# Patient Record
Sex: Male | Born: 1965 | Race: White | Hispanic: No | Marital: Married | Smoking: Former smoker
Health system: Southern US, Community
[De-identification: ages and names within clinical notes are randomized; demographics above are authoritative.]

## PROBLEM LIST (undated history)

## (undated) HISTORY — PX: BACK SURGERY: SHX140

## (undated) HISTORY — PX: OTHER SURGICAL HISTORY: SHX169

## (undated) HISTORY — PX: SHOULDER SURGERY: SHX246

## (undated) HISTORY — PX: WISDOM TOOTH EXTRACTION: SHX21

## (undated) HISTORY — PX: TONSILLECTOMY: SUR1361

## (undated) HISTORY — PX: FOOT SURGERY: SHX648

---

## 2004-02-17 ENCOUNTER — Encounter: Admission: RE | Admit: 2004-02-17 | Discharge: 2004-02-17 | Payer: Self-pay | Admitting: Sports Medicine

## 2004-03-02 ENCOUNTER — Encounter: Admission: RE | Admit: 2004-03-02 | Discharge: 2004-03-02 | Payer: Self-pay | Admitting: Sports Medicine

## 2004-05-06 ENCOUNTER — Encounter: Admission: RE | Admit: 2004-05-06 | Discharge: 2004-05-06 | Payer: Self-pay | Admitting: Sports Medicine

## 2006-03-08 ENCOUNTER — Encounter: Admission: RE | Admit: 2006-03-08 | Discharge: 2006-03-08 | Payer: Self-pay | Admitting: Sports Medicine

## 2006-06-01 ENCOUNTER — Encounter: Admission: RE | Admit: 2006-06-01 | Discharge: 2006-06-01 | Payer: Self-pay | Admitting: Sports Medicine

## 2007-06-07 ENCOUNTER — Encounter: Admission: RE | Admit: 2007-06-07 | Discharge: 2007-06-07 | Payer: Self-pay | Admitting: Sports Medicine

## 2007-10-23 ENCOUNTER — Inpatient Hospital Stay (HOSPITAL_COMMUNITY): Admission: RE | Admit: 2007-10-23 | Discharge: 2007-10-26 | Payer: Self-pay | Admitting: Neurological Surgery

## 2007-10-23 ENCOUNTER — Ambulatory Visit: Payer: Self-pay | Admitting: *Deleted

## 2008-03-02 ENCOUNTER — Emergency Department (HOSPITAL_COMMUNITY): Admission: EM | Admit: 2008-03-02 | Discharge: 2008-03-02 | Payer: Self-pay | Admitting: Emergency Medicine

## 2010-04-25 ENCOUNTER — Encounter: Payer: Self-pay | Admitting: Sports Medicine

## 2010-08-18 NOTE — Discharge Summary (Signed)
NAMEKAMSIYOCHUKWU, SPICKLER                ACCOUNT NO.:  [id number]   MEDICAL RECORD NO.:  [id number]          PATIENT TYPE:  INP   LOCATION:  3039                         FACILITY:  [facility]   PHYSICIAN:  Stefani Dama, M.D.  DATE OF BIRTH:  1965/12/23   DATE OF ADMISSION:  10/23/2007  DATE OF DISCHARGE:  10/26/2007                               DISCHARGE SUMMARY   ADMITTING DIAGNOSES:  1. Spondylolisthesis, L4-L5.  2. Spondylosis, L4-L5, L5-S1.  3. Tobacco abuse.   DISCHARGE DIAGNOSES:  1. Spondylolisthesis, L4-L5.  2. Spondylosis, L4-L5, L5-S1.  3. Tobacco abuse.   BRIEF HISTORY:  The patient is a 45 year old male with severe history of  low back pain progressing over time, unrelieved with conservative  measures,  elects to proceed with surgical intervention.   HOSPITAL COURSE:  Mr. Basey underwent anterior lumbar interbody fusion  at L4-L5 and L5-S1 under general anesthesia on October 23, 2007, he  tolerated the procedure well.  He was stabilized in the recovery room  placed in the step-down unit, PCA morphine pump, and Ancef x3 doses  prophylaxis against infection.  Started with physical therapy and  occupational therapy, progressed with normal postop course.  Vital signs  remained stable throughout his hospitalization.  He was ready for  transfer to 3000 on first day postoperatively.  Continue with PT and OT  until he was ambulating safely.  His Foley catheter was discontinued.  PCA was discontinued.  He was using p.o. pain meds and muscle relaxers.  He was comfortable at the time of discharge.  He was ready for discharge  home on October 26, 2007, eating well and voiding well.  He had positive  bowel movement prior to his discharge.   DISCHARGE CONDITION:  Stable and improved.   DISCHARGE INSTRUCTIONS:  Followup with Dr. Danielle Dess or Orlin Hilding, his PA  in the office in 2 weeks.  Call [phone] for an appointment.  Given  prescription for Percocet 1 p.o. q.4-6 h. p.r.n. pain and Valium  5 mg 1  p.o. q.6-  8 h. p.r.n. muscle spasms.  Back precautions, Aspen QuickDraw brace  while ambulating.  He may shower.  Slowly increase activities.  No  lifting for 8 weeks.  No driving for 2 weeks and contact our office  prior to followup if there are any questions or concerns.      Aura Fey Bobbe Medico.      Stefani Dama, M.D.  Electronically Signed    SCI/MEDQ  D:  10/26/2007  T:  10/27/2007  Job:  [id number]

## 2010-08-18 NOTE — Op Note (Signed)
Micheal Cooper, Micheal Cooper                ACCOUNT NO.:  [id number]   MEDICAL RECORD NO.:  [id number]          PATIENT TYPE:  INP   LOCATION:  3301                         FACILITY:  [facility]   PHYSICIAN:  Stefani Dama, M.D.  DATE OF BIRTH:  1965/09/09   DATE OF PROCEDURE:  10/23/2007  DATE OF DISCHARGE:                               OPERATIVE REPORT   PREOPERATIVE DIAGNOSES:  1. Spondylosis and spondylolisthesis L4-L5 with lumbar radiculopathy.  2. Back pain.   POSTOPERATIVE DIAGNOSES:  1. Spondylosis and spondylolisthesis L4-L5 with lumbar radiculopathy.  2. Back pain.   PROCEDURES:  1. Anterior decompression at L4-5 and L5-S1 with decompression of L4,      L5, and S1 nerve roots bilaterally.  2. Arthrodesis with autograft and allograft and PEEK spacer with plate      fixation L4-5 and L5-S1.   SURGEON:  Stefani Dama, MD   FIRST ASSISTANT:  1. Hilda Lias, MD  2. Hardin Negus, PA  3. Approach is by P. Liliane Bade, MD   INDICATIONS:  Micheal Cooper is a 45 year old individual who has had  significant back pain, bilateral lower extremity pain.  He has evidence  of severe spondylosis with spondylolisthesis at L4-5 and spondylosis at  L5-S1.  He has been advised regarding surgical decompression and  arthrodesis.   PROCEDURE:  The patient was brought to the operating room, supine on the  stretcher.  After smooth induction of general endotracheal anesthesia  and prepping of the abdomen with alcohol and DuraPrep, Dr. Madilyn Fireman  performed the approach to the retroperitoneal space exposing L4 and L5,  which were localized positively on a radiograph.  Then with retractors  in place, I opened the anterior longitudinal ligament at L4-5 with great  difficulty as there was significant ossification in the anterior  longitudinal ligament.  The ossifications were taken down, and the  ligament was removed and the disk space was entered and significant  quantity of severely degenerated and  desiccated material was encountered  until the posterior aspect was identified.  At the posterior aspect,  complete diskectomy was completed out to the lateral recesses.  Once  this area was decompressed well, hemostasis in the epidural space was  obtained.  Care was taken to decompress the central portion of the  canal; however, this was done via subligamentous approach.  Endplates  were then smoothed and then a spacer measuring 12 mm in height, 30 x 33  mm in dimension, and 6 degrees of lordosis was placed into the  interspace.  Attention was then turned to L5-S1, where a similar  procedure was carried out.  Here, there was also substantial amount of  osteophytosis that had formed ventrally and the disk space itself was  even more desiccated.  As the posterior longitudinal ligament was  reached, the area was decompressed in a subligamentous fashion,  decompressing into the foramen at both L5 nerve roots and centrally  decompressed the S1 nerve roots.  The procedure was carried out with the  help of Dr. Jeral Fruit in placing the grafts and the decompression was  helped by  Hardin Negus, who helped to retract the retroperitoneal  contents to maintain exposure.  Ultimately, the spacers that were placed  included a 12 mm tall, 10-degree lordosis, 30 x 33-mm spacer at L5-S1.  Medium-sized nails were used to secure the anterior plate fixation at L4-  5 and L5-S1.  Once this was accomplished, hemostasis was checked in the  retroperitoneal space.  Final localizing radiographs were obtained.  Remainder of allograft was placed into the lateral gutters.  It should  be noted that the allograft was mixed with a bone marrow aspirate  obtained from the L4 vertebra using a Jamshidi needle and mixing this  material with Vitoss bone sponge and infused.  The wound was then closed  with #1 Vicryl in the anterior rectus sheath, 2-0 Vicryl  in the  subcutaneous tissues, 3-0 Vicryl subcuticularly with 4-0 Vicryl   subcuticularly, and Dermabond on the skin.  The patient tolerated  procedure well, was returned to recovery room in stable condition.   BLOOD LOSS ESTIMATED:  400 mL.      Stefani Dama, M.D.  Electronically Signed     HJE/MEDQ  D:  10/23/2007  T:  10/24/2007  Job:  [id number]

## 2010-08-18 NOTE — Op Note (Signed)
NAMECARI, VANDEBERG NO.:  [id number]   MEDICAL RECORD NO.:  [id number]          PATIENT TYPE:  INP   LOCATION:  3301                         FACILITY:  [facility]   PHYSICIAN:  Balinda Quails, M.D.    DATE OF BIRTH:  1966-02-11   DATE OF PROCEDURE:  10/23/2007  DATE OF DISCHARGE:                               OPERATIVE REPORT   SURGEON:  Balinda Quails, MD   CO-SURGEON:  Stefani Dama, MD   ANESTHETIC:  General endotracheal.   ANESTHESIOLOGIST:  Bedelia Person, MD   PREOPERATIVE DIAGNOSIS:  L4-5 and L5-S1 degenerative disk disease.   POSTOPERATIVE DIAGNOSIS:  L4-5 and L5-S1 degenerative disk disease.   PROCEDURE:  L4-5 and L5-S1 anterior lumbar interbody fusion (ALIF).   CLINICAL NOTE:  Dorance Spink is a 45 year old male with history of  chronic back pain and degenerative disease of L4-5 and L5-S1.  Scheduled  at this time to undergo two-level ALIF at L4-5 and L5-S1.  The patient  was seen preoperatively and details of the operative procedure were  reviewed.  Potential complications were reviewed including but not  limited to bleeding, transfusion, infection, DVT, pulmonary embolus,  ureter injury, sexual dysfunction or other complications.  The patient  consented for surgery.   OPERATIVE PROCEDURE:  The patient brought to the operating room in  stable condition.  Placed under general endotracheal anesthesia.  In the  supine position, the abdomen prepped and draped in sterile fashion.  Foley catheter, arterial line and Swan-Ganz catheter in place.  Pulse  oximetry on the left foot.   A longitudinal skin incision made from xiphoid to umbilicus along the  left paramedian line.  Subcutaneous tissue divided with electrocautery.  The left anterior rectus sheath incised longitudinally.  The left rectus  muscle mobilized medially.  The left retroperitoneal space entered  inferiorly and the peritoneum pushed off the posterior rectus sheath  which was incised  longitudinally.  The peritoneal contents rotated  anteriorly.  The psoas muscle and genitofemoral nerve identified and the  nerve preserved on the muscle.  The left common iliac and external iliac  artery were identified.  The ureter and nerve fibers were pushed with  the abdominal contents medially and lymphatics pushed laterally.  The L5-  S1 disk was palpated.  This was cleared with blunt dissection and the  middle sacral vessels were ligated with bipolar cautery and divided.  The L5-S1 disk was fully exposed from left-to-right with blunt  dissection.   Attention then placed on the L4-5 disk.  This could be palpated  laterally with vessels.  The lymphatics pushed laterally.  Two  iliolumbar veins were ligated with 2-0 silk and divided.  This allowed  full mobilization of the iliac vein.  This was mobilized medially.  The  L4 segmental vessels were ligated and divided.  This allowed full  mobilization of the left iliac vessels to the right.  These were  pushed  to the right to allow full exposure of the L4-5 disk.  There was marked  degeneration of the disk noted.  The Thompson-Brau retractor was  then  assembled and the reverse lip blades were placed on the lateral margins  of the bodies of L4-L5 bilaterally.  This allowed full exposure  of the disk.  Dr. Danielle Dess then completed the L4-5 ALIF.  This was  followed by L5-S1 ALIF with reposition of the retractors.   There were no apparent complications.  Dr. Danielle Dess completed closure  dictated under separate heading.      Balinda Quails, M.D.  Electronically Signed     PGH/MEDQ  D:  10/24/2007  T:  10/24/2007  Job:  [id number]

## 2010-12-31 LAB — CBC
HCT: 42.4
Hemoglobin: 15
MCHC: 35.5
MCV: 92.2
Platelets: 178
RBC: 4.6
RDW: 12.7
WBC: 4.6

## 2011-01-01 LAB — ABO/RH: ABO/RH(D): A POS

## 2011-01-01 LAB — TYPE AND SCREEN
ABO/RH(D): A POS
Antibody Screen: NEGATIVE

## 2011-10-22 ENCOUNTER — Emergency Department (HOSPITAL_COMMUNITY)
Admission: EM | Admit: 2011-10-22 | Discharge: 2011-10-22 | Disposition: A | Discharge status: Home / Self Care | Payer: BC Managed Care – PPO | Attending: Emergency Medicine | Admitting: Emergency Medicine

## 2011-10-22 ENCOUNTER — Encounter (HOSPITAL_COMMUNITY): Payer: Self-pay | Admitting: *Deleted

## 2011-10-22 DIAGNOSIS — M7989 Other specified soft tissue disorders: Secondary | ICD-10-CM | POA: Insufficient documentation

## 2011-10-22 DIAGNOSIS — Z7982 Long term (current) use of aspirin: Secondary | ICD-10-CM | POA: Insufficient documentation

## 2011-10-22 MED ORDER — OXYCODONE-ACETAMINOPHEN 5-325 MG PO TABS: 1.0000 | ORAL_TABLET | Freq: Four times a day (QID) | ORAL | Status: AC | PRN

## 2011-10-22 MED ORDER — ENOXAPARIN SODIUM 150 MG/ML ~~LOC~~ SOLN
1.5000 mg/kg | Freq: Once | SUBCUTANEOUS | Status: AC
  Administered 2011-10-22: 145 mg via SUBCUTANEOUS
  Filled 2011-10-22: qty 1.6

## 2011-10-22 NOTE — ED Notes (Signed)
Pt c/o pain and swelling to right lower extremity. States pain started last night. Pt states he frequently goes on long road trips. Pt sent from urgent care to r/o dvt. pts right calf noted to be right and swollen.

## 2011-10-22 NOTE — ED Provider Notes (Signed)
History     CSN: [id number]  Arrival date & time 10/22/11  Micheal Cooper   First MD Initiated Contact with Patient 10/22/11 2038      Chief Complaint  Patient presents with  . Leg Pain    (Consider location/radiation/quality/duration/timing/severity/associated sxs/prior treatment) HPI Comments: Patient with no significant past medical history presents emergency department from urgent care for right lower extremity leg swelling. Onset of symptoms began acutely last evening, have been gradually worsening and are described as calf pain with ambulation, swelling and warmth of calf. Patient denies fevers, night sweats, chills, cough, hemoptysis, SOB, DOE, CP, or palpitations.   Patient is a 46 y.o. male presenting with leg pain. The history is provided by the patient.  Leg Pain     History reviewed. No pertinent past medical history.  Past Surgical History  Procedure Date  . Back surgery   . Tonsillectomy   . Foot surgery   . Wisdom tooth extraction     History reviewed. No pertinent family history.  History  Substance Use Topics  . Smoking status: Not on file  . Smokeless tobacco: Not on file  . Alcohol Use: Yes      Review of Systems  All other systems reviewed and are negative.    Allergies  Review of patient's allergies indicates no known allergies.  Home Medications   Current Outpatient Rx  Name Route Sig Dispense Refill  . ASPIRIN 325 MG PO TABS Oral Take 325 mg by mouth daily.      BP 125/85  Pulse 61  Temp 97.7 F (36.5 C) (Oral)  Resp 18  Wt 210 lb (95.255 kg)  SpO2 97%  Physical Exam  Nursing note and vitals reviewed. Constitutional: He is oriented to person, place, and time. He appears well-developed and well-nourished. No distress.  HENT:  Head: Normocephalic and atraumatic.  Eyes: Conjunctivae and EOM are normal.  Neck: Normal range of motion.  Cardiovascular:       RRR, intact distal pulses  Pulmonary/Chest: Effort normal.       LCAB, no  respiratory distress, pulse ox 100% on RA  Musculoskeletal: Normal range of motion.       Calf pain with ambulation and left foot flexion, calf ttp inflamed and taught. Mild warmth, no erythema  Neurological: He is alert and oriented to person, place, and time.  Skin: Skin is warm and dry. No rash noted. He is not diaphoretic.  Psychiatric: He has a normal mood and affect. His behavior is normal.    ED Course  Procedures (including critical care time)  Labs Reviewed - No data to display No results found.   No diagnosis found.    MDM  ? DVT.   Pt treated w Lovenox in ER with Vascular US pending tmw AM to confirm dx. Strict return precautions discussed including development of fever, SOB, CP, or tachycardia. NO current concern for PE. Pt verbalizes understanding and necessity for follow up & appears reliable. Spoke with wife & answered questions, both agreeable with plan.        Jaci Carrel, [place] 10/22/11 2127

## 2011-10-23 ENCOUNTER — Ambulatory Visit (HOSPITAL_COMMUNITY)
Admission: RE | Admit: 2011-10-23 | Discharge: 2011-10-23 | Payer: BC Managed Care – PPO | Source: Ambulatory Visit | Attending: Emergency Medicine | Admitting: Emergency Medicine

## 2011-10-23 DIAGNOSIS — M7989 Other specified soft tissue disorders: Secondary | ICD-10-CM

## 2011-10-23 NOTE — ED Provider Notes (Signed)
Medical screening examination/treatment/procedure(s) were performed by non-physician practitioner and as supervising physician I was immediately available for consultation/collaboration.   Dshawn Mcnay Y. Donnita Farina, MD 10/23/11 1859 

## 2011-10-24 ENCOUNTER — Emergency Department (HOSPITAL_COMMUNITY): Payer: BC Managed Care – PPO

## 2011-10-24 ENCOUNTER — Encounter (HOSPITAL_COMMUNITY): Payer: Self-pay | Admitting: *Deleted

## 2011-10-24 ENCOUNTER — Emergency Department (HOSPITAL_COMMUNITY)
Admission: EM | Admit: 2011-10-24 | Discharge: 2011-10-25 | Disposition: A | Discharge status: Home / Self Care | Payer: BC Managed Care – PPO | Attending: Emergency Medicine | Admitting: Emergency Medicine

## 2011-10-24 DIAGNOSIS — M7120 Synovial cyst of popliteal space [Baker], unspecified knee: Secondary | ICD-10-CM | POA: Insufficient documentation

## 2011-10-24 DIAGNOSIS — M660 Rupture of popliteal cyst: Secondary | ICD-10-CM

## 2011-10-24 DIAGNOSIS — M7989 Other specified soft tissue disorders: Secondary | ICD-10-CM | POA: Insufficient documentation

## 2011-10-24 DIAGNOSIS — R609 Edema, unspecified: Secondary | ICD-10-CM | POA: Insufficient documentation

## 2011-10-24 DIAGNOSIS — M79609 Pain in unspecified limb: Secondary | ICD-10-CM | POA: Insufficient documentation

## 2011-10-24 MED ORDER — MORPHINE SULFATE 4 MG/ML IJ SOLN
4.0000 mg | Freq: Once | INTRAMUSCULAR | Status: AC
  Administered 2011-10-24: 4 mg via INTRAVENOUS
  Filled 2011-10-24: qty 1

## 2011-10-24 MED ORDER — SODIUM CHLORIDE 0.9 % IV SOLN
Freq: Once | INTRAVENOUS | Status: AC
  Administered 2011-10-24: 16:00:00 via INTRAVENOUS

## 2011-10-24 NOTE — ED Notes (Signed)
Pt c/o r leg swelling and pain since Thursday night.  Pt was seen at [facility] and had dopplers performed and no blood clots were present. Pt is still concerned about swelling. Denies SOB and chest pain.  Was also given Lovenox Thursday at last visit.

## 2011-10-24 NOTE — ED Notes (Signed)
Patient is alert and oriented x3.  He is complaining of right leg swelling with pain of 8 to 10 He was seen at [facility] on Friday for same complaint with no remarkable results.

## 2011-10-25 ENCOUNTER — Ambulatory Visit (HOSPITAL_COMMUNITY)
Admission: RE | Admit: 2011-10-25 | Discharge: 2011-10-25 | Disposition: A | Discharge status: Home / Self Care | Payer: BC Managed Care – PPO | Source: Ambulatory Visit | Attending: Emergency Medicine | Admitting: Emergency Medicine

## 2011-10-25 ENCOUNTER — Other Ambulatory Visit (HOSPITAL_COMMUNITY): Payer: Self-pay | Admitting: Emergency Medicine

## 2011-10-25 DIAGNOSIS — R52 Pain, unspecified: Secondary | ICD-10-CM

## 2011-10-25 MED ORDER — IOHEXOL 350 MG/ML SOLN
125.0000 mL | Freq: Once | INTRAVENOUS | Status: AC | PRN
  Administered 2011-10-25: 125 mL via INTRAVENOUS

## 2011-11-29 ENCOUNTER — Encounter (HOSPITAL_COMMUNITY): Payer: Self-pay | Admitting: Emergency Medicine

## 2011-11-29 NOTE — ED Provider Notes (Signed)
History     CSN: [id number]  Arrival date & time 10/24/11  1407   First MD Initiated Contact with Patient 10/24/11 1544      Chief Complaint  Patient presents with  . Leg Swelling    right    (Consider location/radiation/quality/duration/timing/severity/associated sxs/prior treatment) HPI Comments: Pt reports he had U/S due to continued right lower leg swelling, told it was negative for clot, but continues to swell and have pain.  No numbness or weakness.  No CP, SOB, cough, hemoptysis.  No h/o clot, he is otherwise healthy, travels frequently.    The history is provided by the patient.    History reviewed. No pertinent past medical history.  Past Surgical History  Procedure Date  . Back surgery   . Tonsillectomy   . Foot surgery   . Wisdom tooth extraction     History reviewed. No pertinent family history.  History  Substance Use Topics  . Smoking status: Not on file  . Smokeless tobacco: Not on file  . Alcohol Use: Yes      Review of Systems  Constitutional: Negative.   Respiratory: Negative for cough and shortness of breath.   Cardiovascular: Positive for leg swelling. Negative for chest pain.  Musculoskeletal: Negative for back pain.  Skin: Negative for color change, rash and wound.  Neurological: Negative for syncope.    Allergies  Review of patient's allergies indicates no known allergies.  Home Medications   Current Outpatient Rx  Name Route Sig Dispense Refill  . ALPRAZOLAM 0.5 MG PO TABS Oral Take 0.5 mg by mouth daily as needed. Anxiety    . ASPIRIN 325 MG PO TABS Oral Take 325 mg by mouth daily.      BP 130/90  Pulse 88  Temp 98.1 F (36.7 C) (Oral)  Resp 18  SpO2 98%  Physical Exam  Nursing note and vitals reviewed. Constitutional: He is oriented to person, place, and time. He appears well-developed and well-nourished.  Cardiovascular: Normal rate.   Pulmonary/Chest: Effort normal and breath sounds normal. No respiratory distress.  He has no wheezes.  Abdominal: Soft.  Musculoskeletal: He exhibits edema and tenderness.  Neurological: He is alert and oriented to person, place, and time. He displays no atrophy. No sensory deficit. He exhibits normal muscle tone. Coordination normal.  Skin: Skin is warm and dry. No rash noted. No pallor.  Psychiatric: He has a normal mood and affect.    ED Course  Procedures (including critical care time)  Labs Reviewed - No data to display No results found.   1. Baker's cyst, ruptured       MDM  CT venogram performed which shows hemrorhagic baker's cyst.  No DVT.  Pt told RICE at home.  Pt given IV analgesics and felt improved.          Gavin Pound. Oletta Lamas, MD 11/29/11 4098

## 2011-11-29 NOTE — Discharge Instructions (Signed)
Baker's Cyst  A Baker's cyst is a swelling that forms in the back of the knee. It is a sac-like structure. It is filled with the same fluid that is located in your knee. The fluid located in your knee is necessary because it lubricates the bones and cartilage. It allows them to move over each other more easily.  CAUSES   When the knee becomes injured or has soreness (inflammation) present, more fluid forms in the knee. When this happens, the joint lining is pushed out behind the knee and forms the baker's cyst. This cyst may also be caused by inflammation from arthritic conditions and infections.  DIAGNOSIS   A Baker's cyst is most often diagnosed with an ultrasound. This is a specialized picture (like an X-ray). It shows a picture by using sound waves. Sometimes a specialized x-ray called an MRI (magnetic resonance imaging) is used. This picks up other problems within a joint if an ultrasound alone cannot make the diagnosis. If the cyst came immediately following an injury, plain x-rays may be used to make a diagnosis.  TREATMENT   The treatment depends on the cause of the cyst. But most of these cysts are caused by an inflammation. Anti-inflammatory medications and rest often will get rid of the problem. If the cyst is caused by an infection, medications (antibiotics) will be prescribed to help this. Take the medications as directed. Refer to Home Care Instructions, below, for additional treatment suggestions.  HOME CARE INSTRUCTIONS    If the cyst was caused by an injury, for the first 24 hours, while lying down, keep the injured extremity elevated on 2 pillows.   For the first 24 hours while you are awake, apply ice bags (ice in a plastic bag with a towel around it to prevent frostbite to skin) 3 to 4 times per day for 15 to 20 minutes to the injured area. Then do as directed by your caregiver.   Only take over-the-counter or prescription medicines for pain, discomfort, or fever as directed by your  caregiver.  Persistent pain and inability to use the injured area for more than 2 to 3 days are warning signs indicating that you should see a caregiver for a follow-up visit as soon as possible. Persistent pain and swelling indicate that further evaluation, non-weight bearing (use of crutches as instructed), and/or further x-rays are needed. Make a follow-up appointment with your own caregiver.  If conservative measures (rest, medications and inactivity) do not help the problem get better, sometimes surgery for removal of the cyst is needed. Reasons for this may be that the cyst is pressing on nerves and/or vessels and causing problems which cannot wait for improvement with conservative treatment. If the problem is caused by injuries to the cartilage in the knee, surgery is often needed for treatment of that problem.  MAKE SURE YOU:    Understand these instructions.   Will watch your condition.   Will get help right away if you are not doing well or get worse.  Document Released: 03/22/2005 Document Revised: 03/11/2011 Document Reviewed: 11/08/2007  [employer] Patient Information 2012 [employer]

## 2012-03-15 ENCOUNTER — Ambulatory Visit (INDEPENDENT_AMBULATORY_CARE_PROVIDER_SITE_OTHER): Payer: BC Managed Care – PPO | Admitting: General Surgery

## 2012-03-15 ENCOUNTER — Encounter (INDEPENDENT_AMBULATORY_CARE_PROVIDER_SITE_OTHER): Payer: Self-pay | Admitting: General Surgery

## 2012-03-15 VITALS — BP 122/76 | HR 68 | Temp 97.2°F | Resp 16 | Ht 72.0 in | Wt 212.8 lb

## 2012-03-15 DIAGNOSIS — D179 Benign lipomatous neoplasm, unspecified: Secondary | ICD-10-CM

## 2012-03-15 NOTE — Progress Notes (Signed)
Subjective:     Patient ID: Micheal Cooper, male   DOB: 1965/11/09, 46 y.o.   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 Patient states that this didn't a bit of the last several months.  This is become more bothersome with minimal pain.  Review of Systems  Constitutional: Negative.   HENT: Negative.   Cardiovascular: Negative.   Gastrointestinal: Negative.        Objective:   Physical Exam  Constitutional: He appears well-developed and well-nourished.  HENT:  Head: Normocephalic and atraumatic.  Eyes: Conjunctivae normal are normal. Pupils are equal, round, and reactive to light.  Neck: Normal range of motion. Neck supple.  Cardiovascular: Normal rate, regular rhythm and normal heart sounds.   Pulmonary/Chest: Effort normal and breath sounds normal.    Abdominal: Soft. Bowel sounds are normal.  Musculoskeletal: Normal range of motion.       Assessment:     46 year old male with left scapular lipoma    Plan:     1. With the patient excision of left scapular lipoma  2.All risks and benefits were discussed with the patient, to generally include infection, bleeding, damage to surrounding structures, and recurrence. Alternatives were offered and described.  All questions were answered and the patient voiced understanding of the procedure and wishes to proceed at this point.

## 2012-03-21 ENCOUNTER — Ambulatory Visit (INDEPENDENT_AMBULATORY_CARE_PROVIDER_SITE_OTHER): Payer: BC Managed Care – PPO | Admitting: General Surgery

## 2012-03-31 ENCOUNTER — Other Ambulatory Visit (INDEPENDENT_AMBULATORY_CARE_PROVIDER_SITE_OTHER): Payer: Self-pay | Admitting: General Surgery

## 2012-03-31 DIAGNOSIS — D1739 Benign lipomatous neoplasm of skin and subcutaneous tissue of other sites: Secondary | ICD-10-CM

## 2012-03-31 HISTORY — PX: OTHER SURGICAL HISTORY: SHX169

## 2012-04-18 ENCOUNTER — Encounter (INDEPENDENT_AMBULATORY_CARE_PROVIDER_SITE_OTHER): Payer: BC Managed Care – PPO | Admitting: General Surgery

## 2012-04-28 ENCOUNTER — Ambulatory Visit (INDEPENDENT_AMBULATORY_CARE_PROVIDER_SITE_OTHER): Payer: BC Managed Care – PPO | Admitting: General Surgery

## 2012-04-28 ENCOUNTER — Encounter (INDEPENDENT_AMBULATORY_CARE_PROVIDER_SITE_OTHER): Payer: Self-pay | Admitting: General Surgery

## 2012-04-28 VITALS — BP 126/86 | HR 71 | Temp 97.0°F | Ht 72.0 in | Wt 207.6 lb

## 2012-04-28 DIAGNOSIS — Z9889 Other specified postprocedural states: Secondary | ICD-10-CM

## 2012-04-28 NOTE — Progress Notes (Signed)
Patient ID: Micheal Cooper, male   DOB: [birth year], 47 y.o.   MRN: [id number] The patient's is being seen for status post lipoma excision. Patient is to do well postoperatively without any pain.  Pathology revealed benign lipoma discussed with the patient.  Assessment and plan: Status post lipoma excision  The patient follow up when necessary

## 2016-07-01 ENCOUNTER — Other Ambulatory Visit: Payer: Self-pay | Admitting: General Surgery

## 2016-07-01 DIAGNOSIS — R221 Localized swelling, mass and lump, neck: Secondary | ICD-10-CM

## 2016-07-13 ENCOUNTER — Ambulatory Visit
Admission: RE | Admit: 2016-07-13 | Discharge: 2016-07-13 | Disposition: A | Discharge status: Home / Self Care | Payer: BLUE CROSS/BLUE SHIELD | Source: Ambulatory Visit | Attending: General Surgery | Admitting: General Surgery

## 2016-07-13 DIAGNOSIS — R221 Localized swelling, mass and lump, neck: Secondary | ICD-10-CM

## 2016-07-13 MED ORDER — IOPAMIDOL (ISOVUE-300) INJECTION 61%
75.0000 mL | Freq: Once | INTRAVENOUS | Status: AC | PRN
  Administered 2016-07-13: 75 mL via INTRAVENOUS

## 2017-01-10 ENCOUNTER — Telehealth: Payer: Self-pay | Admitting: *Deleted

## 2017-01-10 NOTE — Telephone Encounter (Signed)
NOTES SENT TO SCHEDULING.  °

## 2017-01-11 ENCOUNTER — Ambulatory Visit (INDEPENDENT_AMBULATORY_CARE_PROVIDER_SITE_OTHER): Payer: BLUE CROSS/BLUE SHIELD | Admitting: Cardiology

## 2017-01-11 ENCOUNTER — Encounter: Payer: Self-pay | Admitting: Cardiology

## 2017-01-11 VITALS — BP 130/80 | HR 89 | Ht 71.0 in | Wt 211.0 lb

## 2017-01-11 DIAGNOSIS — R079 Chest pain, unspecified: Secondary | ICD-10-CM | POA: Diagnosis not present

## 2017-01-11 DIAGNOSIS — R002 Palpitations: Secondary | ICD-10-CM

## 2017-01-11 DIAGNOSIS — E781 Pure hyperglyceridemia: Secondary | ICD-10-CM | POA: Diagnosis not present

## 2017-01-11 DIAGNOSIS — Z87891 Personal history of nicotine dependence: Secondary | ICD-10-CM | POA: Diagnosis not present

## 2017-01-11 NOTE — Addendum Note (Signed)
Addended by: Mattie Marlin on: 01/11/2017 11:20 AM   Modules accepted: Orders

## 2017-01-11 NOTE — Progress Notes (Signed)
Cardiology Office Note:    Date:  01/11/2017   ID:  Micheal Cooper, DOB 1965-10-04, MRN [id number]  PCP:  Aletha Halim., PA-C  Cardiologist:  Jenean Lindau, MD   Referring MD: Aletha Halim., PA-C    ASSESSMENT:    1. Chest pain, unspecified type   2. Palpitations   3. Hypertriglyceridemia   4. Ex-smoker    PLAN:    In order of problems listed above:  1. Primary prevention stressed to the patient. Importance of compliance with diet and medications stressed and he verbalized understanding. 2. To evaluate his chest pain which is rather atypical in nature I would have him to get an exercise stress echo. 3. Patient will undergo 1 week event monitoring and then to assess palpitations. 4. Patient will be seen in follow-up appointment in 1 month or earlier if the patient has any concerns.    Medication Adjustments/Labs and Tests Ordered: Current medicines are reviewed at length with the patient today.  Concerns regarding medicines are outlined above.  No orders of the defined types were placed in this encounter.  No orders of the defined types were placed in this encounter.    History of Present Illness:    Micheal Cooper is a 51 y.o. male who is being seen today for the evaluation of chest pain and palpitations at the request of Aletha Halim., PA-C. Patient is a pleasant 51 year old male. He has past medical history of hypertriglyceridemia. He mentions to me that he is noticing chest pain on-and-off not related to exertion. More importantly his concerns of palpitations and they recur almost every couple of days and his heart rates around 100 or more at times. No radiation of the chest to any other part of the body. At the time of my evaluation is alert awake oriented and in no distress History reviewed. No pertinent past medical history.  Past Surgical History:  Procedure Laterality Date  . BACK SURGERY    . FOOT SURGERY    . scalp lipoma exc  03/31/12  .  TONSILLECTOMY    . WISDOM TOOTH EXTRACTION      Current Medications: Current Meds  Medication Sig  . ALPRAZolam (XANAX) 0.5 MG tablet Take 0.5 mg by mouth daily as needed. Anxiety  . aspirin 325 MG tablet Take 325 mg by mouth daily.  . clomiPHENE (CLOMID) 50 MG tablet Take 50 mg by mouth every other day.  . ergocalciferol (VITAMIN D2) 50000 units capsule Take 50,000 Units by mouth once a week.  . fenofibrate (TRICOR) 145 MG tablet Take 145 mg by mouth daily.     Allergies:   Patient has no known allergies.   Social History   Social History  . Marital status: Married    Spouse name: N/A  . Number of children: N/A  . Years of education: N/A   Social History Main Topics  . Smoking status: Former Smoker    Types: Cigarettes    Quit date: 11/23/2011  . Smokeless tobacco: Never Used  . Alcohol use 2.4 - 3.0 oz/week    1 - 2 Standard drinks or equivalent, 3 Glasses of wine per week  . Drug use: No  . Sexual activity: Not Asked   Other Topics Concern  . None   Social History Narrative  . None     Family History: The patient's Family history is unknown by patient.  ROS:   Please see the history of present illness.    All other systems  reviewed and are negative.  EKGs/Labs/Other Studies Reviewed:    The following studies were reviewed today: I discussed my findings and reviewed his records including EKG and lab work with the patient at extensive length. His TSH has been unremarkable.   Recent Labs: No results found for requested labs within last 8760 hours.  Recent Lipid Panel No results found for: CHOL, TRIG, HDL, CHOLHDL, VLDL, LDLCALC, LDLDIRECT  Physical Exam:    VS:  BP 130/80   Pulse 89   Ht 5\' 11"  (1.803 m)   Wt 211 lb (95.7 kg)   SpO2 98%   BMI 29.43 kg/m     Wt Readings from Last 3 Encounters:  01/11/17 211 lb (95.7 kg)  04/28/12 207 lb 9.6 oz (94.2 kg)  03/15/12 212 lb 12.8 oz (96.5 kg)     GEN: Patient is in no acute distress HEENT:  Normal NECK: No JVD; No carotid bruits LYMPHATICS: No lymphadenopathy CARDIAC: S1 S2 regular, 2/6 systolic murmur at the apex. RESPIRATORY:  Clear to auscultation without rales, wheezing or rhonchi  ABDOMEN: Soft, non-tender, non-distended MUSCULOSKELETAL:  No edema; No deformity  SKIN: Warm and dry NEUROLOGIC:  Alert and oriented x 3 PSYCHIATRIC:  Normal affect    Signed, Jenean Lindau, MD  01/11/2017 11:14 AM    [place]

## 2017-01-11 NOTE — Patient Instructions (Signed)
Medication Instructions:  Your physician recommends that you continue on your current medications as directed. Please refer to the Current Medication list given to you today.   Labwork: None  Testing/Procedures: Your physician has recommended that you wear an event monitor. Event monitors are medical devices that record the heart's electrical activity. Doctors most often [place] these monitors to diagnose arrhythmias. Arrhythmias are problems with the speed or rhythm of the heartbeat. The monitor is a small, portable device. You can wear one while you do your normal daily activities. This is usually used to diagnose what is causing palpitations/syncope (passing out).  Your physician has requested that you have a stress echocardiogram. For further information please visit [email or web address]. Please follow instruction sheet as given.    Follow-Up: 1 month  Any Other Special Instructions Will Be Listed Below (If Applicable).     If you need a refill on your cardiac medications before your next appointment, please call your pharmacy.   Exercise Stress Echocardiogram An exercise stress echocardiogram is a test to check how well your heart is working. This test uses sound waves (ultrasound) and a computer to make images of your heart before and after exercise. Ultrasound images that are taken before you exercise (your resting echocardiogram) will show how much blood is getting to your heart muscle and how well your heart muscle and heart valves are functioning. During the next part of this test, you will walk on a treadmill or ride a stationary bike to see how exercise affects your heart. While you exercise, the electrical activity of your heart will be monitored with an electrocardiogram (ECG). Your blood pressure will also be monitored. You may have this test if you:  Have chest pain or other symptoms of a heart problem.  Recently had a heart attack or heart surgery.  Have heart valve  problems.  Have a condition that causes narrowing of the blood vessels that supply your heart (coronary artery disease).  Have a high risk of heart disease and are starting a new exercise program.  Have a high risk of heart disease and need to have major surgery.  Tell a health care provider about:  Any allergies you have.  All medicines you are taking, including vitamins, herbs, eye drops, creams, and over-the-counter medicines.  Any problems you or family members have had with anesthetic medicines.  Any blood disorders you have.  Any surgeries you have had.  Any medical conditions you have.  Whether you are pregnant or may be pregnant. What are the risks? Generally, this is a safe procedure. However, problems may occur, including:  Chest pain.  Dizziness or light-headedness.  Shortness of breath.  Increased or irregular heartbeat (palpitations).  Nausea or vomiting.  Heart attack (very rare).  What happens before the procedure?  Follow instructions from your health care provider about eating or drinking restrictions. You may be asked to avoid all forms of caffeine for 24 hours before your procedure, or as told by your health care provider.  Ask your health care provider about changing or stopping your regular medicines. This is especially important if you are taking diabetes medicines or blood thinners.  If you use an inhaler, bring it with you to the test.  Wear loose, comfortable clothing and walking shoes.  Do notuse any products that contain nicotine or tobacco, such as cigarettes and e-cigarettes, for 4 hours before the test or as told by your health care provider. If you need help quitting, ask your health care provider.  What happens during the procedure?  You will take off your clothes from the waist up and put on a hospital gown.  A technician will place electrodes on your chest.  A blood pressure cuff will be placed on your arm.  You will lie down  on a table for an ultrasound exam before you exercise. Gel will be rubbed on your chest, and a handheld device (transducer) will be pressed against your chest and moved over your heart.  Then, you will start exercising by walking on a treadmill or pedaling a stationary bicycle.  Your blood pressure and heart rhythm will be monitored while you exercise.  The exercise will gradually get harder or faster.  You will exercise until: ? Your heart reaches a target level. ? You are too tired to continue. ? You cannot continue because of chest pain, weakness, or dizziness.  You will have another ultrasound exam after you stop exercising. The procedure may vary among health care providers and hospitals. What happens after the procedure?  Your heart rate and blood pressure will be monitored until they return to your normal levels. Summary  An exercise stress echocardiogram is a test that uses ultrasound to check how well your heart works before and after exercise.  Before the test, follow instructions from your health care provider about stopping medications, avoiding nicotine and tobacco, and avoiding certain foods and drinks.  During the test, your blood pressure and heart rhythm will be monitored while you exercise on a treadmill or stationary bicycle. This information is not intended to replace advice given to you by your health care provider. Make sure you discuss any questions you have with your health care provider. Document Released: 03/26/2004 Document Revised: 11/12/2015 Document Reviewed: 11/12/2015 Elsevier Interactive Patient Education  2018 [employer].

## 2017-01-19 ENCOUNTER — Ambulatory Visit: Payer: BLUE CROSS/BLUE SHIELD

## 2017-01-19 ENCOUNTER — Ambulatory Visit (HOSPITAL_BASED_OUTPATIENT_CLINIC_OR_DEPARTMENT_OTHER)
Admission: RE | Admit: 2017-01-19 | Discharge: 2017-01-19 | Disposition: A | Discharge status: Home / Self Care | Payer: BLUE CROSS/BLUE SHIELD | Source: Ambulatory Visit | Attending: Cardiology | Admitting: Cardiology

## 2017-01-19 DIAGNOSIS — R002 Palpitations: Secondary | ICD-10-CM

## 2017-01-19 DIAGNOSIS — R079 Chest pain, unspecified: Secondary | ICD-10-CM | POA: Diagnosis not present

## 2017-01-19 NOTE — Progress Notes (Signed)
  Echocardiogram Echocardiogram Stress Test has been performed.  Orvella Digiulio T Keely Drennan 01/19/2017, 2:41 PM

## 2017-01-19 NOTE — Progress Notes (Signed)
  Echocardiogram Echocardiogram Stress Test has been performed.  Soma Lizak T Jule Schlabach 01/19/2017, 2:42 PM

## 2017-01-26 ENCOUNTER — Telehealth: Payer: Self-pay

## 2017-01-26 NOTE — Telephone Encounter (Signed)
Error

## 2017-01-26 NOTE — Telephone Encounter (Signed)
Informed patient of his results from his stress echo. He did not have any further questions regarding this. Educated patient regarding him wearing his monitor.

## 2017-02-07 ENCOUNTER — Ambulatory Visit: Payer: BLUE CROSS/BLUE SHIELD | Admitting: Cardiology

## 2017-02-14 ENCOUNTER — Ambulatory Visit: Payer: BLUE CROSS/BLUE SHIELD | Admitting: Cardiology

## 2017-02-18 ENCOUNTER — Ambulatory Visit: Payer: BLUE CROSS/BLUE SHIELD | Admitting: Cardiology

## 2017-02-28 ENCOUNTER — Ambulatory Visit: Payer: BLUE CROSS/BLUE SHIELD | Admitting: Cardiology

## 2017-03-04 ENCOUNTER — Encounter: Payer: Self-pay | Admitting: Cardiology

## 2017-03-04 ENCOUNTER — Ambulatory Visit: Payer: BLUE CROSS/BLUE SHIELD | Admitting: Cardiology

## 2017-03-04 VITALS — BP 130/80 | HR 74 | Ht 72.0 in | Wt 212.0 lb

## 2017-03-04 DIAGNOSIS — Z87891 Personal history of nicotine dependence: Secondary | ICD-10-CM

## 2017-03-04 DIAGNOSIS — R079 Chest pain, unspecified: Secondary | ICD-10-CM

## 2017-03-04 NOTE — Progress Notes (Signed)
Cardiology Office Note:    Date:  03/04/2017   ID:  Micheal Cooper, DOB [birth year], MRN [id number]  PCP:  Aletha Halim., PA-C  Cardiologist:  Jenean Lindau, MD   Referring MD: Aletha Halim., PA-C    ASSESSMENT:    1. Chest pain, unspecified type   2. Ex-smoker    PLAN:    In order of problems listed above:  1. I reassured the patient about my findings.  Stress echo and has begun an exercise program but is followed by his primary care physician and primary prevention stressed. 2. I also mentioned to the patient about the event monitor and that it was unremarkable and he is happy about it.  He will see me in follow-up appointment if he has any significant concerns or recurrences.   Medication Adjustments/Labs and Tests Ordered: Current medicines are reviewed at length with the patient today.  Concerns regarding medicines are outlined above.  No orders of the defined types were placed in this encounter.  No orders of the defined types were placed in this encounter.    Chief Complaint  Patient presents with  . Follow-up    post monitor results and stress echo     History of Present Illness:    Micheal Cooper is a 51 y.o. male.  The patient was evaluated by me for chest pain and and he is here for follow-up.  This is reassured him admitting feels much better.  Patient is alert awake oriented and in no distress.  History reviewed. No pertinent past medical history.  Past Surgical History:  Procedure Laterality Date  . BACK SURGERY    . FOOT SURGERY    . scalp lipoma exc  03/31/12  . TONSILLECTOMY    . WISDOM TOOTH EXTRACTION      Current Medications: Current Meds  Medication Sig  . ALPRAZolam (XANAX) 0.5 MG tablet Take 0.5 mg by mouth daily as needed. Anxiety  . aspirin 325 MG tablet Take 325 mg by mouth daily.  . clomiPHENE (CLOMID) 50 MG tablet Take 50 mg by mouth every other day.  . ergocalciferol (VITAMIN D2) 50000 units capsule Take 50,000 Units by  mouth once a week.  . fenofibrate (TRICOR) 145 MG tablet Take 145 mg by mouth daily.     Allergies:   Patient has no known allergies.   Social History   Socioeconomic History  . Marital status: Married    Spouse name: None  . Number of children: None  . Years of education: None  . Highest education level: None  Social Needs  . Financial resource strain: None  . Food insecurity - worry: None  . Food insecurity - inability: None  . Transportation needs - medical: None  . Transportation needs - non-medical: None  Occupational History  . None  Tobacco Use  . Smoking status: Former Smoker    Types: Cigarettes    Last attempt to quit: 11/23/2011    Years since quitting: 5.2  . Smokeless tobacco: Never Used  Substance and Sexual Activity  . Alcohol use: Yes    Alcohol/week: 2.4 - 3.0 oz    Types: 1 - 2 Standard drinks or equivalent, 3 Glasses of wine per week  . Drug use: No  . Sexual activity: None  Other Topics Concern  . None  Social History Narrative  . None     Family History: The patient's Family history is unknown by patient.  ROS:   Please see the history of  present illness.    All other systems reviewed and are negative.  EKGs/Labs/Other Studies Reviewed:    The following studies were reviewed today: I reviewed stress echo and event monitor to his satisfaction.   Recent Labs: No results found for requested labs within last 8760 hours.  Recent Lipid Panel No results found for: CHOL, TRIG, HDL, CHOLHDL, VLDL, LDLCALC, LDLDIRECT  Physical Exam:    VS:  BP 130/80 (BP Location: Left Arm, Patient Position: Sitting)   Pulse 74   Ht 6' (1.829 m)   Wt 212 lb (96.2 kg)   SpO2 98%   BMI 28.75 kg/m     Wt Readings from Last 3 Encounters:  03/04/17 212 lb (96.2 kg)  01/11/17 211 lb (95.7 kg)  04/28/12 207 lb 9.6 oz (94.2 kg)     GEN: Patient is in no acute distress HEENT: Normal NECK: No JVD; No carotid bruits LYMPHATICS: No lymphadenopathy CARDIAC:  Hear sounds regular, 2/6 systolic murmur at the apex. RESPIRATORY:  Clear to auscultation without rales, wheezing or rhonchi  ABDOMEN: Soft, non-tender, non-distended MUSCULOSKELETAL:  No edema; No deformity  SKIN: Warm and dry NEUROLOGIC:  Alert and oriented x 3 PSYCHIATRIC:  Normal affect   Signed, Jenean Lindau, MD  03/04/2017 10:46 AM    [place]

## 2017-03-04 NOTE — Patient Instructions (Signed)
Medication Instructions:  Your physician recommends that you continue on your current medications as directed. Please refer to the Current Medication list given to you today.   Labwork: None  Testing/Procedures: None  Follow-Up: Your physician recommends that you schedule a follow-up appointment in: as needed   Any Other Special Instructions Will Be Listed Below (If Applicable).     If you need a refill on your cardiac medications before your next appointment, please call your pharmacy.   [facility]  Ladarren Steiner A, RN, BSN  

## 2017-05-06 ENCOUNTER — Emergency Department (HOSPITAL_COMMUNITY): Payer: BLUE CROSS/BLUE SHIELD

## 2017-05-06 ENCOUNTER — Encounter (HOSPITAL_COMMUNITY): Payer: Self-pay | Admitting: *Deleted

## 2017-05-06 ENCOUNTER — Inpatient Hospital Stay (HOSPITAL_COMMUNITY)
Admission: EM | Admit: 2017-05-06 | Discharge: 2017-05-08 | DRG: 440 | Disposition: A | Discharge status: Home / Self Care | Payer: BLUE CROSS/BLUE SHIELD | Attending: Internal Medicine | Admitting: Internal Medicine

## 2017-05-06 DIAGNOSIS — K852 Alcohol induced acute pancreatitis without necrosis or infection: Principal | ICD-10-CM | POA: Diagnosis present

## 2017-05-06 DIAGNOSIS — E785 Hyperlipidemia, unspecified: Secondary | ICD-10-CM | POA: Diagnosis present

## 2017-05-06 DIAGNOSIS — Z87891 Personal history of nicotine dependence: Secondary | ICD-10-CM

## 2017-05-06 DIAGNOSIS — K859 Acute pancreatitis without necrosis or infection, unspecified: Secondary | ICD-10-CM | POA: Diagnosis not present

## 2017-05-06 DIAGNOSIS — R109 Unspecified abdominal pain: Secondary | ICD-10-CM

## 2017-05-06 DIAGNOSIS — E876 Hypokalemia: Secondary | ICD-10-CM | POA: Diagnosis present

## 2017-05-06 DIAGNOSIS — R112 Nausea with vomiting, unspecified: Secondary | ICD-10-CM

## 2017-05-06 DIAGNOSIS — R1013 Epigastric pain: Secondary | ICD-10-CM | POA: Diagnosis not present

## 2017-05-06 DIAGNOSIS — F1010 Alcohol abuse, uncomplicated: Secondary | ICD-10-CM | POA: Diagnosis present

## 2017-05-06 DIAGNOSIS — F419 Anxiety disorder, unspecified: Secondary | ICD-10-CM | POA: Diagnosis not present

## 2017-05-06 DIAGNOSIS — Z7982 Long term (current) use of aspirin: Secondary | ICD-10-CM

## 2017-05-06 LAB — HEPATIC FUNCTION PANEL
ALT: 29 U/L (ref 17–63)
AST: 32 U/L (ref 15–41)
Albumin: 4.5 g/dL (ref 3.5–5.0)
Alkaline Phosphatase: 38 U/L (ref 38–126)
Bilirubin, Direct: 0.1 mg/dL (ref 0.1–0.5)
Indirect Bilirubin: 0.7 mg/dL (ref 0.3–0.9)
Total Bilirubin: 0.8 mg/dL (ref 0.3–1.2)
Total Protein: 6.8 g/dL (ref 6.5–8.1)

## 2017-05-06 LAB — CBC
HEMATOCRIT: 41.3 % (ref 39.0–52.0)
HEMOGLOBIN: 14.1 g/dL (ref 13.0–17.0)
MCH: 30.9 pg (ref 26.0–34.0)
MCHC: 34.1 g/dL (ref 30.0–36.0)
MCV: 90.6 fL (ref 78.0–100.0)
Platelets: 193 10*3/uL (ref 150–400)
RBC: 4.56 MIL/uL (ref 4.22–5.81)
RDW: 13.3 % (ref 11.5–15.5)
WBC: 9.2 10*3/uL (ref 4.0–10.5)

## 2017-05-06 LAB — BASIC METABOLIC PANEL
ANION GAP: 12 (ref 5–15)
BUN: 20 mg/dL (ref 6–20)
CHLORIDE: 104 mmol/L (ref 101–111)
CO2: 22 mmol/L (ref 22–32)
Calcium: 9.1 mg/dL (ref 8.9–10.3)
Creatinine, Ser: 1.21 mg/dL (ref 0.61–1.24)
GFR calc non Af Amer: >60 mL/min (ref >60)
Glucose, Bld: 131 mg/dL — ABNORMAL HIGH (ref 65–99)
POTASSIUM: 3.8 mmol/L (ref 3.5–5.1)
SODIUM: 138 mmol/L (ref 135–145)

## 2017-05-06 LAB — LIPASE, BLOOD: LIPASE: 112 U/L — AB (ref 11–51)

## 2017-05-06 LAB — I-STAT TROPONIN, ED: Troponin i, poc: 0 ng/mL (ref 0.00–0.08)

## 2017-05-06 MED ORDER — PANTOPRAZOLE SODIUM 40 MG IV SOLR
40.0000 mg | Freq: Two times a day (BID) | INTRAVENOUS | Status: DC
  Administered 2017-05-07 – 2017-05-08 (×3): 40 mg via INTRAVENOUS
  Filled 2017-05-06 (×4): qty 40

## 2017-05-06 MED ORDER — ALBUTEROL SULFATE (2.5 MG/3ML) 0.083% IN NEBU: 2.5000 mg | INHALATION_SOLUTION | Freq: Four times a day (QID) | RESPIRATORY_TRACT | Status: DC | PRN

## 2017-05-06 MED ORDER — SODIUM CHLORIDE 0.9 % IV BOLUS (SEPSIS)
1000.0000 mL | Freq: Once | INTRAVENOUS | Status: AC
  Administered 2017-05-06: 1000 mL via INTRAVENOUS

## 2017-05-06 MED ORDER — OXYCODONE-ACETAMINOPHEN 5-325 MG PO TABS
1.0000 | ORAL_TABLET | Freq: Once | ORAL | Status: AC
  Administered 2017-05-06: 1 via ORAL
  Filled 2017-05-06: qty 1

## 2017-05-06 MED ORDER — ACETAMINOPHEN 325 MG PO TABS: 650.0000 mg | ORAL_TABLET | Freq: Four times a day (QID) | ORAL | Status: DC | PRN

## 2017-05-06 MED ORDER — HYDROMORPHONE HCL 1 MG/ML IJ SOLN
0.5000 mg | INTRAMUSCULAR | Status: DC | PRN
  Administered 2017-05-06 – 2017-05-07 (×5): 1 mg via INTRAVENOUS
  Filled 2017-05-06 (×5): qty 1

## 2017-05-06 MED ORDER — THIAMINE HCL 100 MG/ML IJ SOLN
100.0000 mg | Freq: Every day | INTRAMUSCULAR | Status: DC
  Administered 2017-05-06 – 2017-05-07 (×2): 100 mg via INTRAVENOUS
  Filled 2017-05-06 (×2): qty 2

## 2017-05-06 MED ORDER — FAMOTIDINE IN NACL 20-0.9 MG/50ML-% IV SOLN
20.0000 mg | Freq: Once | INTRAVENOUS | Status: AC
  Administered 2017-05-06: 20 mg via INTRAVENOUS
  Filled 2017-05-06: qty 50

## 2017-05-06 MED ORDER — FENTANYL CITRATE (PF) 100 MCG/2ML IJ SOLN
50.0000 ug | INTRAMUSCULAR | Status: DC | PRN
  Administered 2017-05-06 (×2): 50 ug via INTRAVENOUS
  Filled 2017-05-06 (×2): qty 2

## 2017-05-06 MED ORDER — GI COCKTAIL ~~LOC~~
30.0000 mL | Freq: Once | ORAL | Status: AC
  Administered 2017-05-06: 30 mL via ORAL
  Filled 2017-05-06: qty 30

## 2017-05-06 MED ORDER — ONDANSETRON HCL 4 MG/2ML IJ SOLN
4.0000 mg | Freq: Four times a day (QID) | INTRAMUSCULAR | Status: DC | PRN
  Administered 2017-05-07: 4 mg via INTRAVENOUS
  Filled 2017-05-06: qty 2

## 2017-05-06 MED ORDER — ENOXAPARIN SODIUM 40 MG/0.4ML ~~LOC~~ SOLN
40.0000 mg | SUBCUTANEOUS | Status: DC
  Administered 2017-05-06 – 2017-05-07 (×2): 40 mg via SUBCUTANEOUS
  Filled 2017-05-06 (×2): qty 0.4

## 2017-05-06 MED ORDER — LORAZEPAM 2 MG/ML IJ SOLN: 0.0000 mg | Freq: Two times a day (BID) | INTRAMUSCULAR | Status: DC

## 2017-05-06 MED ORDER — LORAZEPAM 2 MG/ML IJ SOLN: 1.0000 mg | Freq: Four times a day (QID) | INTRAMUSCULAR | Status: DC | PRN

## 2017-05-06 MED ORDER — SODIUM CHLORIDE 0.9 % IV SOLN
INTRAVENOUS | Status: DC
  Administered 2017-05-06 – 2017-05-07 (×2): via INTRAVENOUS

## 2017-05-06 MED ORDER — FOLIC ACID 1 MG PO TABS
1.0000 mg | ORAL_TABLET | Freq: Every day | ORAL | Status: DC
  Administered 2017-05-06 – 2017-05-07 (×2): 1 mg via ORAL
  Filled 2017-05-06 (×2): qty 1

## 2017-05-06 MED ORDER — IOPAMIDOL (ISOVUE-300) INJECTION 61%
INTRAVENOUS | Status: AC
  Administered 2017-05-06: 100 mL
  Filled 2017-05-06: qty 100

## 2017-05-06 MED ORDER — ONDANSETRON HCL 4 MG PO TABS: 4.0000 mg | ORAL_TABLET | Freq: Four times a day (QID) | ORAL | Status: DC | PRN

## 2017-05-06 MED ORDER — HYDROMORPHONE HCL 1 MG/ML IJ SOLN
1.0000 mg | Freq: Once | INTRAMUSCULAR | Status: AC
  Administered 2017-05-06: 1 mg via INTRAVENOUS
  Filled 2017-05-06: qty 1

## 2017-05-06 MED ORDER — LORAZEPAM 1 MG PO TABS: 1.0000 mg | ORAL_TABLET | Freq: Four times a day (QID) | ORAL | Status: DC | PRN

## 2017-05-06 MED ORDER — ACETAMINOPHEN 650 MG RE SUPP: 650.0000 mg | Freq: Four times a day (QID) | RECTAL | Status: DC | PRN

## 2017-05-06 MED ORDER — ADULT MULTIVITAMIN W/MINERALS CH
1.0000 | ORAL_TABLET | Freq: Every day | ORAL | Status: DC
  Administered 2017-05-06 – 2017-05-08 (×3): 1 via ORAL
  Filled 2017-05-06 (×3): qty 1

## 2017-05-06 MED ORDER — LORAZEPAM 2 MG/ML IJ SOLN
0.0000 mg | Freq: Four times a day (QID) | INTRAMUSCULAR | Status: DC
  Administered 2017-05-06: 1 mg via INTRAVENOUS
  Filled 2017-05-06: qty 1

## 2017-05-06 MED ORDER — VITAMIN B-1 100 MG PO TABS
100.0000 mg | ORAL_TABLET | Freq: Every day | ORAL | Status: DC
  Administered 2017-05-08: 100 mg via ORAL
  Filled 2017-05-06 (×2): qty 1

## 2017-05-06 NOTE — ED Notes (Signed)
Lab work, radiology results and vital signs reviewed, no critical results at this time, no change in acuity indicated.  

## 2017-05-06 NOTE — ED Provider Notes (Signed)
[facility] EMERGENCY DEPARTMENT Provider Note   CSN: [id number] Arrival date & time: 05/06/17  1348     History   Chief Complaint Chief Complaint  Patient presents with  . Abdominal Pain    HPI Margues Cooper is a 52 y.o. male.  Patient presents with epigastric pain that awoke him from sleep this morning at 5:00. Patient's had 2 episodes of vomiting since. Nonradiating. Pain is intermittent and sharp. No current shortness of breath or upper chest pain. No cardiac history.atient is an ex cigarette smoker. Patient has history of back and foot surgery. No recent surgery, no leg swelling. Patient does drink alcohol heavily. No history of pancreatitis. No blood in the stools and patients passing gas normal bowel movements. No history of reflux or ulcer known.      History reviewed. No pertinent past medical history.  Patient Active Problem List   Diagnosis Date Noted  . Chest pain 01/11/2017  . Palpitations 01/11/2017  . Hypertriglyceridemia 01/11/2017  . Ex-smoker 01/11/2017    Past Surgical History:  Procedure Laterality Date  . BACK SURGERY    . FOOT SURGERY    . knee surgery Right   . scalp lipoma exc  03/31/12  . SHOULDER SURGERY Right   . TONSILLECTOMY    . WISDOM TOOTH EXTRACTION         Home Medications    Prior to Admission medications   Medication Sig Start Date End Date Taking? Authorizing Provider  ALPRAZolam Duanne Moron) 0.5 MG tablet Take 0.5 mg by mouth daily as needed. Anxiety   Yes [provider]  clomiPHENE (CLOMID) 50 MG tablet Take 50 mg by mouth every other day.   Yes [provider]  ECHINACEA PO Take 1 capsule by mouth daily.   Yes [provider]  ergocalciferol (VITAMIN D2) 50000 units capsule Take 50,000 Units by mouth once a week.   Yes [provider]  fenofibrate (TRICOR) 145 MG tablet Take 145 mg by mouth daily.   Yes [provider]  Multiple Vitamins-Minerals (MULTIVITAMIN  WITH MINERALS) tablet Take 1 tablet by mouth daily.   Yes [provider]    Family History Family History  Family history unknown: Yes    Social History Social History   Tobacco Use  . Smoking status: Former Smoker    Types: Cigarettes    Last attempt to quit: 11/23/2011    Years since quitting: 5.4  . Smokeless tobacco: Never Used  Substance Use Topics  . Alcohol use: Yes    Alcohol/week: 2.4 - 3.0 oz    Types: 3 Glasses of wine, 1 - 2 Standard drinks or equivalent per week  . Drug use: No     Allergies   Patient has no known allergies.   Review of Systems Review of Systems  Constitutional: Negative for chills and fever.  HENT: Negative for congestion.   Eyes: Negative for visual disturbance.  Respiratory: Negative for shortness of breath.   Cardiovascular: Negative for chest pain.  Gastrointestinal: Positive for abdominal pain, nausea and vomiting.  Genitourinary: Negative for dysuria and flank pain.  Musculoskeletal: Negative for back pain, neck pain and neck stiffness.  Skin: Negative for rash.  Neurological: Negative for light-headedness and headaches.     Physical Exam Updated Vital Signs BP (!) 144/97   Pulse 76   Temp 97.6 F (36.4 C) (Oral)   Resp 18   Ht 5\' 11"  (1.803 m)   Wt 96.2 kg (212 lb)  SpO2 100%   BMI 29.57 kg/m   Physical Exam  Constitutional: He is oriented to person, place, and time. He appears well-developed and well-nourished.  HENT:  Head: Normocephalic and atraumatic.  Eyes: Conjunctivae are normal. Right eye exhibits no discharge. Left eye exhibits no discharge.  Neck: Normal range of motion. Neck supple. No tracheal deviation present.  Cardiovascular: Normal rate and regular rhythm.  Pulmonary/Chest: Effort normal and breath sounds normal.  Abdominal: Soft. He exhibits no distension. There is tenderness (epigastric moderate). There is no guarding.  Musculoskeletal: He exhibits no edema.  Neurological: He is alert  and oriented to person, place, and time.  Skin: Skin is warm. No rash noted.  Psychiatric: He has a normal mood and affect.  Nursing note and vitals reviewed.    ED Treatments / Results  Labs (all labs ordered are listed, but only abnormal results are displayed) Labs Reviewed  BASIC METABOLIC PANEL - Abnormal; Notable for the following components:      Result Value   Glucose, Bld 131 (*)    All other components within normal limits  LIPASE, BLOOD - Abnormal; Notable for the following components:   Lipase 112 (*)    All other components within normal limits  CBC  HEPATIC FUNCTION PANEL  I-STAT TROPONIN, ED    EKG  EKG Interpretation  Date/Time:  Friday May 06 2017 14:24:53 EST Ventricular Rate:  64 PR Interval:  136 QRS Duration: 92 QT Interval:  400 QTC Calculation: 412 R Axis:   53 Text Interpretation:  Normal sinus rhythm Normal ECG Confirmed by Elnora Morrison [phone]) on 05/06/2017 4:57:08 PM       Radiology Dg Chest 2 View  Result Date: 05/06/2017 CLINICAL DATA:  Pt c/o mid non radiating intermittent CP that awoke him from sleep today at 5am, pt reports x 2 vomiting episodes d/t pain, pt reports SOB with interrmittent sharp pain, pt denies diarrhea EXAM: CHEST  2 VIEW COMPARISON:  None. FINDINGS: Cardiac silhouette is normal in size. No mediastinal or hilar masses. No evidence of adenopathy. Lungs are clear.  No pleural effusion or pneumothorax. Skeletal structures are intact. IMPRESSION: No active cardiopulmonary disease. Electronically Signed   By: Lajean Manes M.D.   On: 05/06/2017 15:19   Ct Abdomen Pelvis W Contrast  Result Date: 05/06/2017 CLINICAL DATA:  Intermittent sharp chest pain and shortness of breath. Two episodes of vomiting. EXAM: CT ABDOMEN AND PELVIS WITH CONTRAST TECHNIQUE: Multidetector CT imaging of the abdomen and pelvis was performed using the standard protocol following bolus administration of intravenous contrast. CONTRAST:  133mL ISOVUE-300  IOPAMIDOL (ISOVUE-300) INJECTION 61% COMPARISON:  None. FINDINGS: Lower chest: No acute abnormality. Hepatobiliary: Hepatic steatosis. Normal appearance of the gallbladder. Pancreas: Normal appearance of the pancreas. Mild peripancreatic fat stranding and small amount of fluid adjacent to the second portion of the duodenum. Spleen: Normal in size without focal abnormality. Adrenals/Urinary Tract: Adrenal glands are unremarkable. Kidneys are without renal calculi, focal lesion, or hydronephrosis. Left superior pole renal scarring. Bladder is unremarkable. Stomach/Bowel: Stomach is within normal limits. Appendix appears normal. No evidence of bowel wall thickening, distention, or inflammatory changes. Scattered colonic diverticulosis. No evidence of diverticulitis. Vascular/Lymphatic: No significant vascular findings are present. Mild upper abdominal retroperitoneal lymphadenopathy. Reproductive: Prostate is unremarkable. Other: No abdominal wall hernia or abnormality. No abdominopelvic ascites. Musculoskeletal: L4-L5 and L5-S1 anterior fusion. Posterior facet arthropathy at the same levels. Mild multilevel osteoarthritic changes of the visualized spine otherwise. IMPRESSION: Inflammatory fat stranding surrounding the pancreatic head  and the second portion of the duodenum with small amount of free fluid tracking along the second portion of the duodenum. Findings may represent acute pancreatitis or duodenitis/duodenal ulcer. Mild associated upper abdominal retroperitoneal lymphadenopathy. Hepatic steatosis. Electronically Signed   By: Fidela Salisbury M.D.   On: 05/06/2017 18:41    Procedures Procedures (including critical care time)  Medications Ordered in ED Medications  fentaNYL (SUBLIMAZE) injection 50 mcg (50 mcg Intravenous Given 05/06/17 1741)  gi cocktail (Maalox,Lidocaine,Donnatal) (not administered)  oxyCODONE-acetaminophen (PERCOCET/ROXICET) 5-325 MG per tablet 1 tablet (1 tablet Oral Given 05/06/17  1603)  famotidine (PEPCID) IVPB 20 mg premix (0 mg Intravenous Stopped 05/06/17 1811)  sodium chloride 0.9 % bolus 1,000 mL (0 mLs Intravenous Stopped 05/06/17 1946)  iopamidol (ISOVUE-300) 61 % injection (100 mLs  Contrast Given 05/06/17 1814)  HYDROmorphone (DILAUDID) injection 1 mg (1 mg Intravenous Given 05/06/17 1947)     Initial Impression / Assessment and Plan / ED Course  I have reviewed the triage vital signs and the nursing notes.  Pertinent labs & imaging results that were available during my care of the patient were reviewed by me and considered in my medical decision making (see chart for details).    Patient presents with moderate to severe epigastric abdominal pain. Discussed differential diagnosis with him including pancreatitis, gastritis, ulcer, atypical other. With persistent pain and no history of similar plan for IV pain meds, acid medications, CT scan for further delineation and  ensure no signs of perforation.  Patient having worsening pain on recheck. Plan for observation the hospital for pain control, stomach/bowel rest. This is likely from alcohol induced pancreatitis versus ulcer.  The patients results and plan were reviewed and discussed.   Any x-rays performed were independently reviewed by myself.   Differential diagnosis were considered with the presenting HPI.  Medications  fentaNYL (SUBLIMAZE) injection 50 mcg (50 mcg Intravenous Given 05/06/17 1741)  gi cocktail (Maalox,Lidocaine,Donnatal) (not administered)  oxyCODONE-acetaminophen (PERCOCET/ROXICET) 5-325 MG per tablet 1 tablet (1 tablet Oral Given 05/06/17 1603)  famotidine (PEPCID) IVPB 20 mg premix (0 mg Intravenous Stopped 05/06/17 1811)  sodium chloride 0.9 % bolus 1,000 mL (0 mLs Intravenous Stopped 05/06/17 1946)  iopamidol (ISOVUE-300) 61 % injection (100 mLs  Contrast Given 05/06/17 1814)  HYDROmorphone (DILAUDID) injection 1 mg (1 mg Intravenous Given 05/06/17 1947)    Vitals:   05/06/17 1645 05/06/17 1700  05/06/17 1742 05/06/17 1945  BP: 140/81 (!) 139/92 121/81 (!) 144/97  Pulse: 72 74 70 76  Resp:   18   Temp:      TempSrc:      SpO2: 100% 100% 99% 100%  Weight:      Height:        Final diagnoses:  Acute abdominal pain  Acute pancreatitis, unspecified complication status, unspecified pancreatitis type    Admission/ observation were discussed with the admitting physician, patient and/or family and they are comfortable with the plan.    Final Clinical Impressions(s) / ED Diagnoses   Final diagnoses:  Acute abdominal pain  Acute pancreatitis, unspecified complication status, unspecified pancreatitis type    ED Discharge Orders    None       Elnora Morrison, MD 05/06/17 2020

## 2017-05-06 NOTE — ED Triage Notes (Signed)
Pt c/o mid non radiating intermittent CP that awoke him from sleep today at 5am, pt reports x 2 vomiting episodes d/t pain, pt reports SOB with interrmittent sharp pain, pt denies diarrhea, pt reports some relief with vicodin at 11 am today, A&O x4

## 2017-05-06 NOTE — H&P (Signed)
History and Physical    Pax Reasoner IRJ:[id number] DOB: 04-04-1966 DOA: 05/06/2017  Referring MD/NP/PA: Dr. Elnora Morrison PCP: Aletha Halim., PA-C  Patient coming from: home  Chief Complaint: Epigastric pain  I have personally briefly reviewed patient's old medical records in [place]   HPI: Micheal Cooper is a 52 y.o. male with medical history significant of dyslipidemia, alcohol abuse, and h/O tobacco use; who presents with complaints of acute onset of epigastric abdominal pain at 5 AM this morning.  Describes it as a sharp stabbing constant pain that he rated as a 10/10 on the pain scale.  Pain did not radiate anywhere.  Reports having associated symptoms of nausea and vomiting.  Emesis was reported to be of stomach contents and nonbloody.  Patient tried taking a Prilosec and a leftover hydrocodone that he had with only mild relief.  Denies any significant use of NSAIDs, no recent medication changes, fever, chills, constipation, chest pain, shortness of breath, or cough.  He reports being on fenofibrate for elevated triglycerides in the past.  Normally patient drinks 5 glasses of red wine per day, but denies any previous history of alcohol withdrawal symptoms.  Followed by Dr. Benson Norway of gastroenterology and  has had a colonoscopy with removal of some lesions in the past.  ED Course: Upon admission to the emergency department patient was seen to be afebrile vital signs relatively within normal limits.  Labs were notable for BUN 20, creatinine 1.21, glucose 131, AST 32, ALT 29, lipase 112, troponin 0, and all other labs relatively unremarkable.  CT scan of the abdomen showed inflammation surrounding the head of the pancreas second part of duodenum questioning acute pancreatitis vs. duodenitis/duodenal ulcer.  Patient was given 1 L of normal saline IV fluids, oxycodone 5 mg, 20 mg of Pepcid IV, fentanyl IV, and Dilaudid IV while in the ED.  [facility] called to admit  Review of Systems    Constitutional: Negative for chills, diaphoresis and fever.  HENT: Negative for ear pain and nosebleeds.   Eyes: Negative for photophobia and pain.  Respiratory: Negative for cough, hemoptysis and shortness of breath.   Cardiovascular: Negative for chest pain and palpitations.  Gastrointestinal: Positive for abdominal pain, nausea and vomiting.  Genitourinary: Negative for dysuria and flank pain.  Musculoskeletal: Negative for falls and neck pain.  Skin: Negative for itching and rash.  Neurological: Negative for sensory change and focal weakness.  Endo/Heme/Allergies: Negative for polydipsia. Does not bruise/bleed easily.  Psychiatric/Behavioral: Positive for substance abuse. The patient is nervous/anxious.     History reviewed. No pertinent past medical history.  Past Surgical History:  Procedure Laterality Date  . BACK SURGERY    . FOOT SURGERY    . knee surgery Right   . scalp lipoma exc  03/31/12  . SHOULDER SURGERY Right   . TONSILLECTOMY    . WISDOM TOOTH EXTRACTION       reports that he quit smoking about 5 years ago. His smoking use included cigarettes. he has never used smokeless tobacco. He reports that he drinks about 2.4 - 3.0 oz of alcohol per week. He reports that he does not use drugs.  No Known Allergies  Family History  Family history unknown: Yes    Prior to Admission medications   Medication Sig Start Date End Date Taking? Authorizing Provider  ALPRAZolam Duanne Moron) 0.5 MG tablet Take 0.5 mg by mouth daily as needed. Anxiety    [provider]  aspirin 325 MG tablet Take 325  mg by mouth daily.    [provider]  clomiPHENE (CLOMID) 50 MG tablet Take 50 mg by mouth every other day.    [provider]  ergocalciferol (VITAMIN D2) 50000 units capsule Take 50,000 Units by mouth once a week.    [provider]  fenofibrate (TRICOR) 145 MG tablet Take 145 mg by mouth daily.    [provider]    Physical  Exam:  Constitutional: Middle-aged male who appears to be in some mild discomfort Vitals:   05/06/17 1645 05/06/17 1700 05/06/17 1742 05/06/17 1945  BP: 140/81 (!) 139/92 121/81 (!) 144/97  Pulse: 72 74 70 76  Resp:   18   Temp:      TempSrc:      SpO2: 100% 100% 99% 100%  Weight:      Height:       Eyes: PERRL, lids and conjunctivae normal ENMT: Mucous membranes are moist. Posterior pharynx clear of any exudate or lesions.Normal dentition.  Neck: normal, supple, no masses, no thyromegaly Respiratory: clear to auscultation bilaterally, no wheezing, no crackles. Normal respiratory effort. No accessory muscle use.  Cardiovascular: Regular rate and rhythm, no murmurs / rubs / gallops. No extremity edema. 2+ pedal pulses. No carotid bruits.  Abdomen: Mild epigastric tenderness noted on palpation, no masses palpated. No hepatosplenomegaly. Bowel sounds positive.  Musculoskeletal: no clubbing / cyanosis. No joint deformity upper and lower extremities. Good ROM, no contractures. Normal muscle tone.  Skin: no rashes, lesions, ulcers. No induration Neurologic: CN 2-12 grossly intact. Sensation intact, DTR normal. Strength 5/5 in all 4.  Psychiatric: Normal judgment and insight. Alert and oriented x 3. Normal mood.     Labs on Admission: I have personally reviewed following labs and imaging studies  CBC: Recent Labs  Lab 05/06/17 1440  WBC 9.2  HGB 14.1  HCT 41.3  MCV 90.6  PLT 009   Basic Metabolic Panel: Recent Labs  Lab 05/06/17 1440  NA 138  K 3.8  CL 104  CO2 22  GLUCOSE 131*  BUN 20  CREATININE 1.21  CALCIUM 9.1   GFR: Estimated Creatinine Clearance: 84.5 mL/min (by C-G formula based on SCr of 1.21 mg/dL). Liver Function Tests: Recent Labs  Lab 05/06/17 1733  AST 32  ALT 29  ALKPHOS 38  BILITOT 0.8  PROT 6.8  ALBUMIN 4.5   Recent Labs  Lab 05/06/17 1447  LIPASE 112*   No results for input(s): AMMONIA in the last 168 hours. Coagulation Profile: No  results for input(s): INR, PROTIME in the last 168 hours. Cardiac Enzymes: No results for input(s): CKTOTAL, CKMB, CKMBINDEX, TROPONINI in the last 168 hours. BNP (last 3 results) No results for input(s): PROBNP in the last 8760 hours. HbA1C: No results for input(s): HGBA1C in the last 72 hours. CBG: No results for input(s): GLUCAP in the last 168 hours. Lipid Profile: No results for input(s): CHOL, HDL, LDLCALC, TRIG, CHOLHDL, LDLDIRECT in the last 72 hours. Thyroid Function Tests: No results for input(s): TSH, T4TOTAL, FREET4, T3FREE, THYROIDAB in the last 72 hours. Anemia Panel: No results for input(s): VITAMINB12, FOLATE, FERRITIN, TIBC, IRON, RETICCTPCT in the last 72 hours. Urine analysis: No results found for: COLORURINE, APPEARANCEUR, LABSPEC, PHURINE, GLUCOSEU, HGBUR, BILIRUBINUR, KETONESUR, PROTEINUR, UROBILINOGEN, NITRITE, LEUKOCYTESUR Sepsis Labs: No results found for this or any previous visit (from the past 240 hour(s)).   Radiological Exams on Admission: Dg Chest 2 View  Result Date: 05/06/2017 CLINICAL DATA:  Pt c/o mid non radiating intermittent  CP that awoke him from sleep today at 5am, pt reports x 2 vomiting episodes d/t pain, pt reports SOB with interrmittent sharp pain, pt denies diarrhea EXAM: CHEST  2 VIEW COMPARISON:  None. FINDINGS: Cardiac silhouette is normal in size. No mediastinal or hilar masses. No evidence of adenopathy. Lungs are clear.  No pleural effusion or pneumothorax. Skeletal structures are intact. IMPRESSION: No active cardiopulmonary disease. Electronically Signed   By: Lajean Manes M.D.   On: 05/06/2017 15:19   Ct Abdomen Pelvis W Contrast  Result Date: 05/06/2017 CLINICAL DATA:  Intermittent sharp chest pain and shortness of breath. Two episodes of vomiting. EXAM: CT ABDOMEN AND PELVIS WITH CONTRAST TECHNIQUE: Multidetector CT imaging of the abdomen and pelvis was performed using the standard protocol following bolus administration of intravenous  contrast. CONTRAST:  167mL ISOVUE-300 IOPAMIDOL (ISOVUE-300) INJECTION 61% COMPARISON:  None. FINDINGS: Lower chest: No acute abnormality. Hepatobiliary: Hepatic steatosis. Normal appearance of the gallbladder. Pancreas: Normal appearance of the pancreas. Mild peripancreatic fat stranding and small amount of fluid adjacent to the second portion of the duodenum. Spleen: Normal in size without focal abnormality. Adrenals/Urinary Tract: Adrenal glands are unremarkable. Kidneys are without renal calculi, focal lesion, or hydronephrosis. Left superior pole renal scarring. Bladder is unremarkable. Stomach/Bowel: Stomach is within normal limits. Appendix appears normal. No evidence of bowel wall thickening, distention, or inflammatory changes. Scattered colonic diverticulosis. No evidence of diverticulitis. Vascular/Lymphatic: No significant vascular findings are present. Mild upper abdominal retroperitoneal lymphadenopathy. Reproductive: Prostate is unremarkable. Other: No abdominal wall hernia or abnormality. No abdominopelvic ascites. Musculoskeletal: L4-L5 and L5-S1 anterior fusion. Posterior facet arthropathy at the same levels. Mild multilevel osteoarthritic changes of the visualized spine otherwise. IMPRESSION: Inflammatory fat stranding surrounding the pancreatic head and the second portion of the duodenum with small amount of free fluid tracking along the second portion of the duodenum. Findings may represent acute pancreatitis or duodenitis/duodenal ulcer. Mild associated upper abdominal retroperitoneal lymphadenopathy. Hepatic steatosis. Electronically Signed   By: Fidela Salisbury M.D.   On: 05/06/2017 18:41    EKG: Independently reviewed.  Normal sinus rhythm at 64 bpm  Assessment/Plan Acute epigastric pain, pancreatitis vs. Duodenitis/duodenal ulcer: Presents with acute onset of sharp epigastric pain.  Lipase mildly elevated at 112.  CT scan showing inflammation around the pancreatic head and  duodenum.  Risk factors include alcohol abuse and elevated triglycerides history.   - Admit to a telemetry bed - IV fluids of normal saline at 150 mL/h - Protonix IV - Check lipid panel - Hydromorphone IV prn moderate to severe pain - N.p.o. after midnight - May warrant consult gastroenterology in a.m. for possible need of EGD the patient is followed by Dr. Benson Norway   Alcohol abuse: Patient reports drinking 5 glasses of red wine on a daily basis. - CWIA protocol with scheduled Ativan - Counseled on need of cessation of alcohol abuse  Nausea and vomiting - Antiemetics as needed  Dyslipidemia: Patient reports history of elevated triglyceride levels in the past. - Follow-up lipid panel  Anxiety - Patient on CWIA protocol with Ativan   DVT prophylaxis:lovenox  Code Status: Full  Family Communication: Discussed plan of care with the patient family present at bedside Disposition Plan: Likely discharge home in 1-2 days Consults called: none  Admission status: observation  Norval Morton MD [facility] Pager [phone]   If 7PM-7AM, please contact night-coverage [email or web address] Password [facility]  05/06/2017, 7:56 PM

## 2017-05-06 NOTE — ED Notes (Signed)
Lab notified about the add on lipase order

## 2017-05-06 NOTE — ED Notes (Signed)
ED Provider at bedside. 

## 2017-05-07 ENCOUNTER — Other Ambulatory Visit: Payer: Self-pay

## 2017-05-07 DIAGNOSIS — R109 Unspecified abdominal pain: Secondary | ICD-10-CM | POA: Diagnosis present

## 2017-05-07 DIAGNOSIS — Z87891 Personal history of nicotine dependence: Secondary | ICD-10-CM | POA: Diagnosis not present

## 2017-05-07 DIAGNOSIS — F1010 Alcohol abuse, uncomplicated: Secondary | ICD-10-CM | POA: Diagnosis present

## 2017-05-07 DIAGNOSIS — E785 Hyperlipidemia, unspecified: Secondary | ICD-10-CM

## 2017-05-07 DIAGNOSIS — K859 Acute pancreatitis without necrosis or infection, unspecified: Secondary | ICD-10-CM | POA: Diagnosis not present

## 2017-05-07 DIAGNOSIS — K852 Alcohol induced acute pancreatitis without necrosis or infection: Secondary | ICD-10-CM | POA: Diagnosis present

## 2017-05-07 DIAGNOSIS — R1013 Epigastric pain: Secondary | ICD-10-CM | POA: Diagnosis not present

## 2017-05-07 DIAGNOSIS — R112 Nausea with vomiting, unspecified: Secondary | ICD-10-CM | POA: Diagnosis present

## 2017-05-07 DIAGNOSIS — E876 Hypokalemia: Secondary | ICD-10-CM | POA: Diagnosis not present

## 2017-05-07 DIAGNOSIS — Z7982 Long term (current) use of aspirin: Secondary | ICD-10-CM | POA: Diagnosis not present

## 2017-05-07 DIAGNOSIS — F419 Anxiety disorder, unspecified: Secondary | ICD-10-CM | POA: Diagnosis present

## 2017-05-07 LAB — CBC
HCT: 40.4 % (ref 39.0–52.0)
HEMOGLOBIN: 13.5 g/dL (ref 13.0–17.0)
MCH: 30.8 pg (ref 26.0–34.0)
MCHC: 33.4 g/dL (ref 30.0–36.0)
MCV: 92 fL (ref 78.0–100.0)
PLATELETS: 199 10*3/uL (ref 150–400)
RBC: 4.39 MIL/uL (ref 4.22–5.81)
RDW: 13.7 % (ref 11.5–15.5)
WBC: 9.5 10*3/uL (ref 4.0–10.5)

## 2017-05-07 LAB — BASIC METABOLIC PANEL
ANION GAP: 11 (ref 5–15)
BUN: 14 mg/dL (ref 6–20)
CO2: 23 mmol/L (ref 22–32)
Calcium: 8.6 mg/dL — ABNORMAL LOW (ref 8.9–10.3)
Chloride: 101 mmol/L (ref 101–111)
Creatinine, Ser: 1.13 mg/dL (ref 0.61–1.24)
Glucose, Bld: 111 mg/dL — ABNORMAL HIGH (ref 65–99)
POTASSIUM: 3.2 mmol/L — AB (ref 3.5–5.1)
SODIUM: 135 mmol/L (ref 135–145)

## 2017-05-07 MED ORDER — POTASSIUM CHLORIDE 10 MEQ/100ML IV SOLN
10.0000 meq | INTRAVENOUS | Status: AC
  Administered 2017-05-07 (×3): 10 meq via INTRAVENOUS
  Filled 2017-05-07 (×3): qty 100

## 2017-05-07 MED ORDER — POTASSIUM CHLORIDE CRYS ER 20 MEQ PO TBCR
40.0000 meq | EXTENDED_RELEASE_TABLET | Freq: Once | ORAL | Status: AC
  Administered 2017-05-07: 40 meq via ORAL
  Filled 2017-05-07: qty 2

## 2017-05-07 MED ORDER — SUCRALFATE 1 GM/10ML PO SUSP
1.0000 g | Freq: Three times a day (TID) | ORAL | Status: DC
  Administered 2017-05-07 – 2017-05-08 (×4): 1 g via ORAL
  Filled 2017-05-07 (×4): qty 10

## 2017-05-07 MED ORDER — DEXTROSE-NACL 5-0.9 % IV SOLN
INTRAVENOUS | Status: DC
  Administered 2017-05-07 – 2017-05-08 (×2): via INTRAVENOUS

## 2017-05-07 MED ORDER — MORPHINE SULFATE (PF) 2 MG/ML IV SOLN
1.0000 mg | INTRAVENOUS | Status: DC | PRN
  Administered 2017-05-07 (×2): 1 mg via INTRAVENOUS
  Filled 2017-05-07 (×2): qty 1

## 2017-05-07 NOTE — Progress Notes (Signed)
Patient admitted from ED to room [phone]. Alert oriented x4. Room air. Breaths even unlabored, Heart rate regular, Abdominal hypoactive bowel sounds, denies nausea at this time. Epigastric pain 4/10, too soon for opiods. Patient admitted for treatment of acute pancreatitis. Family at bedside, oriented to room and falls prevention.

## 2017-05-07 NOTE — Plan of Care (Signed)
  Progressing Education: Knowledge of General Education information will improve 05/07/2017 0524 - Progressing by Verne Grain, RN Health Behavior/Discharge Planning: Ability to manage health-related needs will improve 05/07/2017 0524 - Progressing by Verne Grain, RN Clinical Measurements: Ability to maintain clinical measurements within normal limits will improve 05/07/2017 0524 - Progressing by Verne Grain, RN Will remain free from infection 05/07/2017 0524 - Progressing by Verne Grain, RN Diagnostic test results will improve 05/07/2017 0524 - Progressing by Verne Grain, RN Respiratory complications will improve 05/07/2017 0524 - Progressing by Verne Grain, RN Cardiovascular complication will be avoided 05/07/2017 0524 - Progressing by Verne Grain, RN Activity: Risk for activity intolerance will decrease 05/07/2017 0524 - Progressing by Verne Grain, RN Nutrition: Adequate nutrition will be maintained 05/07/2017 0524 - Progressing by Verne Grain, RN Coping: Level of anxiety will decrease 05/07/2017 0524 - Progressing by Verne Grain, RN Elimination: Will not experience complications related to bowel motility 05/07/2017 0524 - Progressing by Verne Grain, RN Will not experience complications related to urinary retention 05/07/2017 0524 - Progressing by Verne Grain, RN Pain Managment: General experience of comfort will improve 05/07/2017 0524 - Progressing by Verne Grain, RN Safety: Ability to remain free from injury will improve 05/07/2017 0524 - Progressing by Verne Grain, RN Skin Integrity: Risk for impaired skin integrity will decrease 05/07/2017 0524 - Progressing by Verne Grain, RN Nutritional: Ability to achieve adequate nutritional intake will improve 05/07/2017 0524 - Progressing by Verne Grain, RN Clinical Measurements: Complications related to the disease process, condition or treatment will be avoided or minimized 05/07/2017 0524 -  Progressing by Verne Grain, RN

## 2017-05-07 NOTE — Progress Notes (Signed)
PROGRESS NOTE    Micheal Cooper  UXN:[id number] DOB: 1965/08/01 DOA: 05/06/2017 PCP: Aletha Halim., PA-C    Brief Narrative:  52 year old male who presented with epigastric pain. Patient does have significant past medical history of dyslipidemia, alcohol abuse and tobacco abuse.. Presents with acute epigastric abdominal pain, sharp in nature, 10/10 in intensity, with no radiation, associated with vomiting. On initial physical examination blood pressure 140/81, heart rate 72, respiratory rate 18, oxygen saturation 100%. Moist mucous membranes, lungs clear to auscultation bilaterally, heart S1- S2 present rhythmic, abdomen tender to palpation in the epigastric region, no rebound, no guarding, no lower extremity edema. Sodium 138, potassium 3.8, chloride 104, bicarbonate 22, glucose 130, BUN 20, creatinine 1.21, white cell count 9.2, hemoglobin 14.1, hematocrit 41.3, platelets 193. Lipase 112. Total bilirubin 0.8. Chest x-ray negative for infiltrates. CT of the abdomen with inflammatory fat stranding around the pancreatic head and second portion of duodenum. EKG normal sinus rhythm, 64 bpm, normal axis, normal intervals.  Patient admitted with a working diagnosis of acute pancreatitis, suspect alcohol induced.   Assessment & Plan:   Principal Problem:   Epigastric pain Active Problems:   Pancreatitis   Dyslipidemia   Nausea and vomiting   Anxiety   1. Acute alcoholic pancreatitis. Clinically improving, will advance diet to clears, will continue supportive medical therapy with IV fluids and IV analgesics (change hydromorphone to morphine), IV antiacids and as needed antiemetics.   2. Ethos abuse. Continue alcohol withdrawal prophylaxis with as needed benzodiazepines. Neuro checks per unit protocol.   3. Dyslipidemia. Continue statin therapy per outpatient regiment.   4. Hypokalemia. Serum K at 3.1, has received 30 meq IV, will add 40 po and will follow on electrolytes in am, renal function  continue to be preserved, continue IV fluids.   DVT prophylaxis: enoxaparin  Code Status:  full Family Communication:  No family at the bedside Disposition Plan:  home   Consultants:     Procedures:     Antimicrobials:       Subjective: Patient feeling better, abdominal pain improved with analgesics, no further nausea or vomiting, no fever or chills.   Objective: Vitals:   05/06/17 2015 05/06/17 2045 05/06/17 2123 05/07/17 0454  BP: (!) 158/103 (!) 129/98 138/79 137/83  Pulse: 74 69 (!) 56 63  Resp:   18 16  Temp:   97.7 F (36.5 C) 97.9 F (36.6 C)  TempSrc:      SpO2: 99% 96% 100% 100%  Weight:      Height:        Intake/Output Summary (Last 24 hours) at 05/07/2017 1012 Last data filed at 05/07/2017 2542 Gross per 24 hour  Intake 2365 ml  Output -  Net 2365 ml   Filed Weights   05/06/17 1429  Weight: 96.2 kg (212 lb)    Examination:   General: Not in pain or dyspnea, deconditioned Neurology: Awake and alert, non focal  E ENT: no pallor, no icterus, oral mucosa moist Cardiovascular: No JVD. S1-S2 present, rhythmic, no gallops, rubs, or murmurs. No lower extremity edema. Pulmonary: vesicular breath sounds bilaterally, adequate air movement, no wheezing, rhonchi or rales. Gastrointestinal. Abdomen protuberant, tender to palpation in the epigastric region, no organomegaly, no rebound or guarding Skin. No rashes Musculoskeletal: no joint deformities     Data Reviewed: I have personally reviewed following labs and imaging studies  CBC: Recent Labs  Lab 05/06/17 1440 05/07/17 0223  WBC 9.2 9.5  HGB 14.1 13.5  HCT 41.3 40.4  MCV 90.6 92.0  PLT 193 017   Basic Metabolic Panel: Recent Labs  Lab 05/06/17 1440 05/07/17 0223  NA 138 135  K 3.8 3.2*  CL 104 101  CO2 22 23  GLUCOSE 131* 111*  BUN 20 14  CREATININE 1.21 1.13  CALCIUM 9.1 8.6*   GFR: Estimated Creatinine Clearance: 90.5 mL/min (by C-G formula based on SCr of 1.13  mg/dL). Liver Function Tests: Recent Labs  Lab 05/06/17 1733  AST 32  ALT 29  ALKPHOS 38  BILITOT 0.8  PROT 6.8  ALBUMIN 4.5   Recent Labs  Lab 05/06/17 1447  LIPASE 112*   No results for input(s): AMMONIA in the last 168 hours. Coagulation Profile: No results for input(s): INR, PROTIME in the last 168 hours. Cardiac Enzymes: No results for input(s): CKTOTAL, CKMB, CKMBINDEX, TROPONINI in the last 168 hours. BNP (last 3 results) No results for input(s): PROBNP in the last 8760 hours. HbA1C: No results for input(s): HGBA1C in the last 72 hours. CBG: No results for input(s): GLUCAP in the last 168 hours. Lipid Profile: No results for input(s): CHOL, HDL, LDLCALC, TRIG, CHOLHDL, LDLDIRECT in the last 72 hours. Thyroid Function Tests: No results for input(s): TSH, T4TOTAL, FREET4, T3FREE, THYROIDAB in the last 72 hours. Anemia Panel: No results for input(s): VITAMINB12, FOLATE, FERRITIN, TIBC, IRON, RETICCTPCT in the last 72 hours.    Radiology Studies: I have reviewed all of the imaging during this hospital visit personally     Scheduled Meds: . enoxaparin (LOVENOX) injection  40 mg Subcutaneous Q24H  . folic acid  1 mg Oral Daily  . LORazepam  0-4 mg Intravenous Q6H   Followed by  . [START ON 05/08/2017] LORazepam  0-4 mg Intravenous Q12H  . multivitamin with minerals  1 tablet Oral Daily  . pantoprazole (PROTONIX) IV  40 mg Intravenous Q12H  . thiamine  100 mg Oral Daily   Or  . thiamine  100 mg Intravenous Daily   Continuous Infusions: . sodium chloride 150 mL/hr at 05/07/17 0548     LOS: 0 days        Tawni Millers, MD [facility] Pager [phone]

## 2017-05-08 DIAGNOSIS — F419 Anxiety disorder, unspecified: Secondary | ICD-10-CM

## 2017-05-08 LAB — BASIC METABOLIC PANEL
Anion gap: 9 (ref 5–15)
BUN: 5 mg/dL — AB (ref 6–20)
CHLORIDE: 104 mmol/L (ref 101–111)
CO2: 25 mmol/L (ref 22–32)
CREATININE: 1.18 mg/dL (ref 0.61–1.24)
Calcium: 9.1 mg/dL (ref 8.9–10.3)
GFR calc Af Amer: >60 mL/min (ref >60)
GFR calc non Af Amer: >60 mL/min (ref >60)
GLUCOSE: 105 mg/dL — AB (ref 65–99)
POTASSIUM: 3.6 mmol/L (ref 3.5–5.1)
SODIUM: 138 mmol/L (ref 135–145)

## 2017-05-08 MED ORDER — PANTOPRAZOLE SODIUM 40 MG PO TBEC: 40.0000 mg | DELAYED_RELEASE_TABLET | Freq: Every day | ORAL | 0 refills | Status: AC

## 2017-05-08 MED ORDER — MORPHINE SULFATE (PF) 2 MG/ML IV SOLN
2.0000 mg | INTRAVENOUS | Status: DC | PRN
  Administered 2017-05-08: 2 mg via INTRAVENOUS
  Filled 2017-05-08: qty 1

## 2017-05-08 NOTE — Discharge Summary (Signed)
Physician Discharge Summary  Haytham Maher WUX:[id number] DOB: 10-23-1965 DOA: 05/06/2017  PCP: Aletha Halim., PA-C  Admit date: 05/06/2017 Discharge date: 05/08/2017  Admitted From:  Home Disposition:  Home  Recommendations for Outpatient Follow-up and new medication changes:  1. Follow up with PCP in 1- weeks 2. Patient was advised to avoid alcohol abuse 3. Patient has been placed on pantoprazole for dyspepsia.    Home Health: No  Equipment/Devices: No   Discharge Condition: Stabl CODE STATUS: full  Diet recommendation: Regular  Brief/Interim Summary: 51 year old male who presented with epigastric pain. Patient does have a significant past medical history of dyslipidemia, alcohol abuse and tobacco abuse. Presents with acute epigastric abdominal pain, sharp in nature, 10/10 in intensity, with no radiation, associated with vomiting. On initial physical examination blood pressure 140/81, heart rate 72, respiratory rate 18, oxygen saturation 100%. Moist mucous membranes, lungs clear to auscultation bilaterally, heart S1- S2 present rhythmic, abdomen tender to palpation in the epigastric region, no rebound, no guarding, no lower extremity edema. Sodium 138, potassium 3.8, chloride 104, bicarbonate 22, glucose 130, BUN 20, creatinine 1.21, white cell count 9.2, hemoglobin 14.1, hematocrit 41.3, platelets 193. Lipase 112. Total bilirubin 0.8. Chest x-ray negative for infiltrates. CT of the abdomen with inflammatory fat stranding around the pancreatic head and second portion of duodenum. EKG normal sinus rhythm, 64 bpm, normal axis, normal intervals.  Patient admitted with a working diagnosis of acute pancreatitis, suspect alcohol induced.    1. Acute interstitial pancreatitis, alcoholic pancreatitis. Patient was admitted to the medical ward, he was placed on IV fluids, IV analgesics, IV antiacids and as needed antiemetics.  Responded well to the medical supportive therapy, diet was advanced  slowly with good toleration. By the time of discharge no further abdominal pain, nausea or vomiting. He was strongly advised to avoid alcohol. The CT of the abdomen show normal gallbladder. Patient will be discharged on pantoprazole.   2. Hypokalemia. Likely related to gastrointestinal losses, related to acute pancreatitis. Patient received potassium chloride with good toleration, discharge potassium is 3.6, with a serum creatinine of 1.18, serum bicarbonate 25.  3. Alcohol abuse. Patient was placed on benzodiazepines per protocol for alcohol withdrawal. Patient did not show any significant signs of withdrawal.  4. Dyslipidemia. Continue fenofibrate.     Discharge Diagnoses:  Principal Problem:   Epigastric pain Active Problems:   Pancreatitis   Dyslipidemia   Nausea and vomiting   Anxiety    Discharge Instructions   Allergies as of 05/08/2017   No Known Allergies     Medication List    TAKE these medications   ALPRAZolam 0.5 MG tablet Commonly known as:  XANAX Take 0.5 mg by mouth daily as needed. Anxiety   clomiPHENE 50 MG tablet Commonly known as:  CLOMID Take 50 mg by mouth every other day.   ECHINACEA PO Take 1 capsule by mouth daily.   ergocalciferol 50000 units capsule Commonly known as:  VITAMIN D2 Take 50,000 Units by mouth once a week.   fenofibrate 145 MG tablet Commonly known as:  TRICOR Take 145 mg by mouth daily.   multivitamin with minerals tablet Take 1 tablet by mouth daily.   pantoprazole 40 MG tablet Commonly known as:  PROTONIX Take 1 tablet (40 mg total) by mouth daily.      Follow-up Information    Aletha Halim., PA-C. Schedule an appointment as soon as possible for a visit in 3 day(s).   Specialty:  Family Medicine Contact information:  [address] [place] [address] [phone]          No Known Allergies  Consultations:     Procedures/Studies: Dg Chest 2 View  Result Date: 05/06/2017 CLINICAL DATA:   Pt c/o mid non radiating intermittent CP that awoke him from sleep today at 5am, pt reports x 2 vomiting episodes d/t pain, pt reports SOB with interrmittent sharp pain, pt denies diarrhea EXAM: CHEST  2 VIEW COMPARISON:  None. FINDINGS: Cardiac silhouette is normal in size. No mediastinal or hilar masses. No evidence of adenopathy. Lungs are clear.  No pleural effusion or pneumothorax. Skeletal structures are intact. IMPRESSION: No active cardiopulmonary disease. Electronically Signed   By: Lajean Manes M.D.   On: 05/06/2017 15:19   Ct Abdomen Pelvis W Contrast  Result Date: 05/06/2017 CLINICAL DATA:  Intermittent sharp chest pain and shortness of breath. Two episodes of vomiting. EXAM: CT ABDOMEN AND PELVIS WITH CONTRAST TECHNIQUE: Multidetector CT imaging of the abdomen and pelvis was performed using the standard protocol following bolus administration of intravenous contrast. CONTRAST:  110mL ISOVUE-300 IOPAMIDOL (ISOVUE-300) INJECTION 61% COMPARISON:  None. FINDINGS: Lower chest: No acute abnormality. Hepatobiliary: Hepatic steatosis. Normal appearance of the gallbladder. Pancreas: Normal appearance of the pancreas. Mild peripancreatic fat stranding and small amount of fluid adjacent to the second portion of the duodenum. Spleen: Normal in size without focal abnormality. Adrenals/Urinary Tract: Adrenal glands are unremarkable. Kidneys are without renal calculi, focal lesion, or hydronephrosis. Left superior pole renal scarring. Bladder is unremarkable. Stomach/Bowel: Stomach is within normal limits. Appendix appears normal. No evidence of bowel wall thickening, distention, or inflammatory changes. Scattered colonic diverticulosis. No evidence of diverticulitis. Vascular/Lymphatic: No significant vascular findings are present. Mild upper abdominal retroperitoneal lymphadenopathy. Reproductive: Prostate is unremarkable. Other: No abdominal wall hernia or abnormality. No abdominopelvic ascites.  Musculoskeletal: L4-L5 and L5-S1 anterior fusion. Posterior facet arthropathy at the same levels. Mild multilevel osteoarthritic changes of the visualized spine otherwise. IMPRESSION: Inflammatory fat stranding surrounding the pancreatic head and the second portion of the duodenum with small amount of free fluid tracking along the second portion of the duodenum. Findings may represent acute pancreatitis or duodenitis/duodenal ulcer. Mild associated upper abdominal retroperitoneal lymphadenopathy. Hepatic steatosis. Electronically Signed   By: Fidela Salisbury M.D.   On: 05/06/2017 18:41       Subjective: Patient feeling well, no further abdominal pain this am, no nausea or vomiting, no chest pain or dyspnea.   Discharge Exam: Vitals:   05/08/17 0008 05/08/17 0635  BP: 140/81 132/79  Pulse: 62 62  Resp: 16 18  Temp: 97.8 F (36.6 C) 98.1 F (36.7 C)  SpO2: 98% 98%   Vitals:   05/07/17 0454 05/07/17 1400 05/08/17 0008 05/08/17 0635  BP: 137/83 116/77 140/81 132/79  Pulse: 63 68 62 62  Resp: 16 18 16 18   Temp: 97.9 F (36.6 C) (!) 97.5 F (36.4 C) 97.8 F (36.6 C) 98.1 F (36.7 C)  TempSrc:  Oral Oral Oral  SpO2: 100% 99% 98% 98%  Weight:      Height:        General: Not in pain or dyspnea Neurology: Awake and alert, non focal  E ENT: no pallor, no icterus, oral mucosa moist Cardiovascular: No JVD. S1-S2 present, rhythmic, no gallops, rubs, or murmurs. No lower extremity edema. Pulmonary: vesicular breath sounds bilaterally, adequate air movement, no wheezing, rhonchi or rales. Gastrointestinal. Abdomen flat, no organomegaly, non tender, no rebound or guarding Skin. No rashes Musculoskeletal: no joint  deformities   The results of significant diagnostics from this hospitalization (including imaging, microbiology, ancillary and laboratory) are listed below for reference.     Microbiology: No results found for this or any previous visit (from the past 240 hour(s)).    Labs: BNP (last 3 results) No results for input(s): BNP in the last 8760 hours. Basic Metabolic Panel: Recent Labs  Lab 05/06/17 1440 05/07/17 0223 05/08/17 0824  NA 138 135 138  K 3.8 3.2* 3.6  CL 104 101 104  CO2 22 23 25   GLUCOSE 131* 111* 105*  BUN 20 14 5*  CREATININE 1.21 1.13 1.18  CALCIUM 9.1 8.6* 9.1   Liver Function Tests: Recent Labs  Lab 05/06/17 1733  AST 32  ALT 29  ALKPHOS 38  BILITOT 0.8  PROT 6.8  ALBUMIN 4.5   Recent Labs  Lab 05/06/17 1447  LIPASE 112*   No results for input(s): AMMONIA in the last 168 hours. CBC: Recent Labs  Lab 05/06/17 1440 05/07/17 0223  WBC 9.2 9.5  HGB 14.1 13.5  HCT 41.3 40.4  MCV 90.6 92.0  PLT 193 199   Cardiac Enzymes: No results for input(s): CKTOTAL, CKMB, CKMBINDEX, TROPONINI in the last 168 hours. BNP: Invalid input(s): POCBNP CBG: No results for input(s): GLUCAP in the last 168 hours. D-Dimer No results for input(s): DDIMER in the last 72 hours. Hgb A1c No results for input(s): HGBA1C in the last 72 hours. Lipid Profile No results for input(s): CHOL, HDL, LDLCALC, TRIG, CHOLHDL, LDLDIRECT in the last 72 hours. Thyroid function studies No results for input(s): TSH, T4TOTAL, T3FREE, THYROIDAB in the last 72 hours.  Invalid input(s): FREET3 Anemia work up No results for input(s): VITAMINB12, FOLATE, FERRITIN, TIBC, IRON, RETICCTPCT in the last 72 hours. Urinalysis No results found for: COLORURINE, APPEARANCEUR, LABSPEC, [place], GLUCOSEU, HGBUR, BILIRUBINUR, KETONESUR, PROTEINUR, UROBILINOGEN, NITRITE, LEUKOCYTESUR Sepsis Labs Invalid input(s): PROCALCITONIN,  WBC,  LACTICIDVEN Microbiology No results found for this or any previous visit (from the past 240 hour(s)).   Time coordinating discharge: 45 minutes  SIGNED:   Tawni Millers, MD  [facility] 05/08/2017, 1:02 PM Pager [phone]  If 7PM-7AM, please contact night-coverage [email or web address] Password [email or web address]

## 2017-05-08 NOTE — Progress Notes (Signed)
Patient complains that Morphine is not helping pain like Dilaudid did and wanted RN to ask if he could be switched back to Dilaudid for pain.  RN paged Jeannette Corpus, NP and made her aware, no Dilaudid ordered, but Morphine increased to 2 mg q 4 hours PRN.  RN will continue to monitor patient and make MD aware of any changes.  P.J. Ulises Wolfinger,RN

## 2017-05-08 NOTE — Progress Notes (Signed)
Nsg Discharge Note  Admit Date:  05/06/2017 Discharge date: 05/08/2017   Melissa Pulido to be D/C'd Home per MD order.  AVS completed.  Copy for chart, and copy for patient signed, and dated. Patient/caregiver able to verbalize understanding.  Discharge Medication: Allergies as of 05/08/2017   No Known Allergies     Medication List    TAKE these medications   ALPRAZolam 0.5 MG tablet Commonly known as:  XANAX Take 0.5 mg by mouth daily as needed. Anxiety   clomiPHENE 50 MG tablet Commonly known as:  CLOMID Take 50 mg by mouth every other day.   ECHINACEA PO Take 1 capsule by mouth daily.   ergocalciferol 50000 units capsule Commonly known as:  VITAMIN D2 Take 50,000 Units by mouth once a week.   fenofibrate 145 MG tablet Commonly known as:  TRICOR Take 145 mg by mouth daily.   multivitamin with minerals tablet Take 1 tablet by mouth daily.   pantoprazole 40 MG tablet Commonly known as:  PROTONIX Take 1 tablet (40 mg total) by mouth daily.       Discharge Assessment: Vitals:   05/08/17 0008 05/08/17 0635  BP: 140/81 132/79  Pulse: 62 62  Resp: 16 18  Temp: 97.8 F (36.6 C) 98.1 F (36.7 C)  SpO2: 98% 98%   Skin clean, dry and intact without evidence of skin break down, no evidence of skin tears noted. IV catheter discontinued intact. Site without signs and symptoms of complications - no redness or edema noted at insertion site, patient denies c/o pain - only slight tenderness at site.  Dressing with slight pressure applied.  D/c Instructions-Education: Discharge instructions given to patient/family with verbalized understanding. D/c education completed with patient/family including follow up instructions, medication list, d/c activities limitations if indicated, with other d/c instructions as indicated by MD - patient able to verbalize understanding, all questions fully answered. Patient instructed to return to ED, call 911, or call MD for any changes in condition.   Patient escorted via [place], and D/C home via private auto.  Salley Slaughter, RN 05/08/2017 2:54 PM

## 2017-05-08 NOTE — Progress Notes (Signed)
RN paged Micheal Corpus, NP to request lipase be added to morning lab work per patient's request.  Lipase level was previously elevated and patient would like to know results, awaiting response.  P.J. Linus Mako, Therapist, sports.

## 2017-05-10 LAB — HIV ANTIBODY (ROUTINE TESTING W REFLEX): HIV SCREEN 4TH GENERATION: NONREACTIVE

## 2017-06-17 ENCOUNTER — Other Ambulatory Visit: Payer: Self-pay | Admitting: Surgery

## 2017-06-27 ENCOUNTER — Other Ambulatory Visit: Payer: Self-pay | Admitting: Surgery

## 2019-01-25 ENCOUNTER — Other Ambulatory Visit: Payer: Self-pay

## 2019-01-25 DIAGNOSIS — Z20822 Contact with and (suspected) exposure to covid-19: Secondary | ICD-10-CM

## 2019-01-27 LAB — NOVEL CORONAVIRUS, NAA: SARS-CoV-2, NAA: NOT DETECTED

## 2019-11-01 IMAGING — CT CT ABD-PELV W/ CM
2 of 5 series · 16 of 46 positions shown, 18 images · IV contrast (iopamidol)
Comparison: None.

CLINICAL DATA: Intermittent sharp chest pain and shortness of
breath. Two episodes of vomiting.

EXAM:
CT ABDOMEN AND PELVIS WITH CONTRAST
TECHNIQUE: Multidetector CT imaging of the abdomen and pelvis was performed
using the standard protocol following bolus administration of
intravenous contrast.
CONTRAST:  100mL 486AQD-ZRR IOPAMIDOL (486AQD-ZRR) INJECTION 61%

[Series 3: abd/ pelvis 5.0 i30f 2 · axial · 0.88mm/px · z∈[+674,+1134]mm · 13 of 104 slices shown, 15 images]
[im 6/104  soft-tissue]
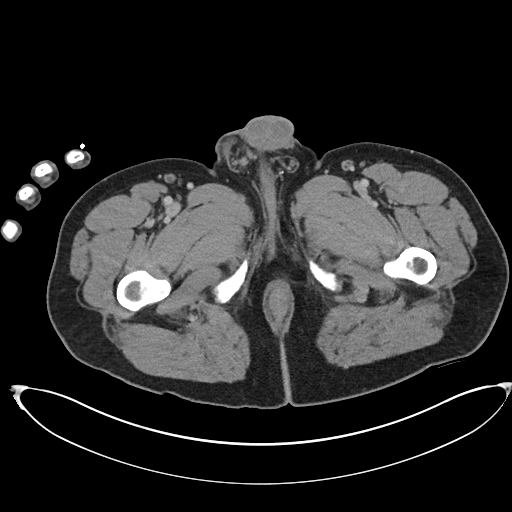
[im 6/104  bone]
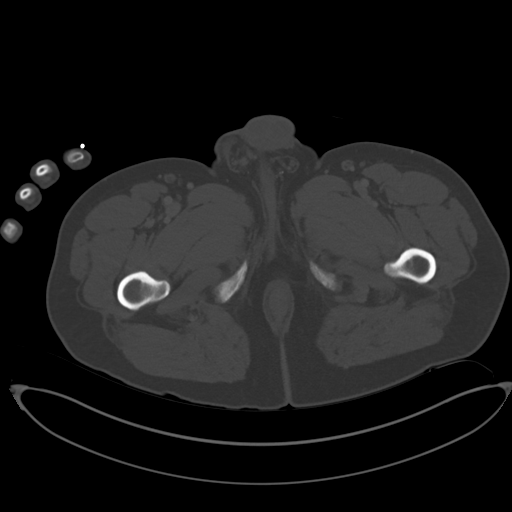
[im 16/104  soft-tissue]
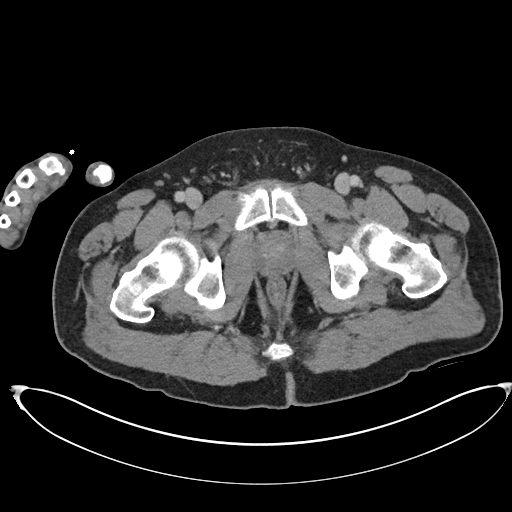
[im 21/104  soft-tissue]
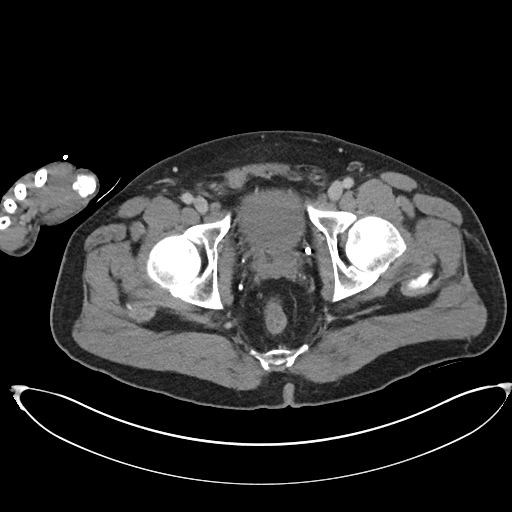
[im 31/104  soft-tissue]
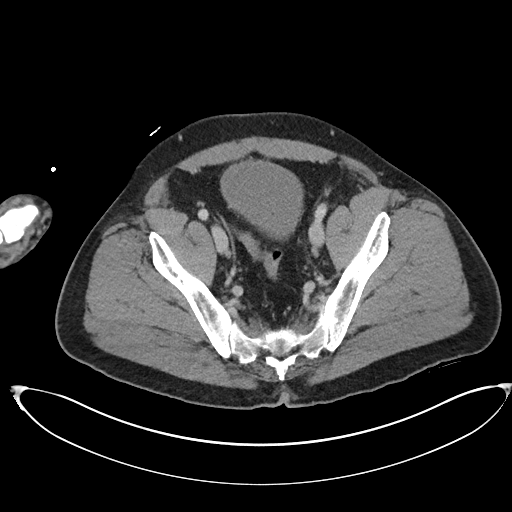
[im 37/104  soft-tissue]
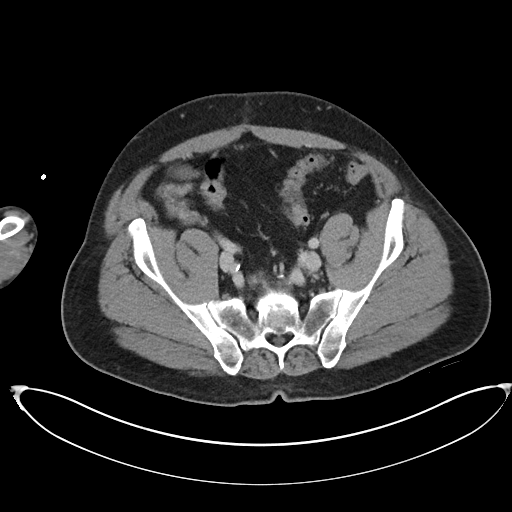
[im 47/104  soft-tissue]
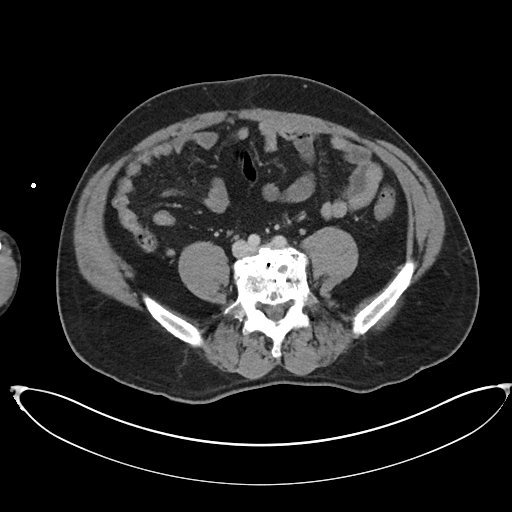
[im 52/104  soft-tissue]
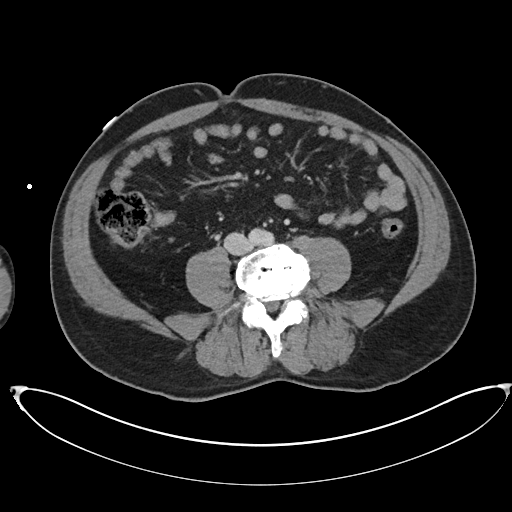
[im 57/104  soft-tissue]
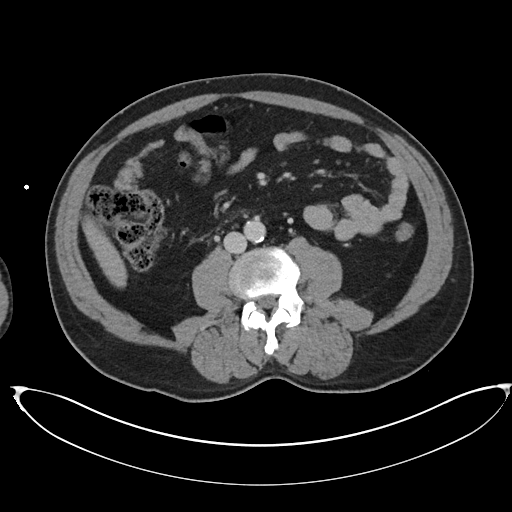
[im 67/104  soft-tissue]
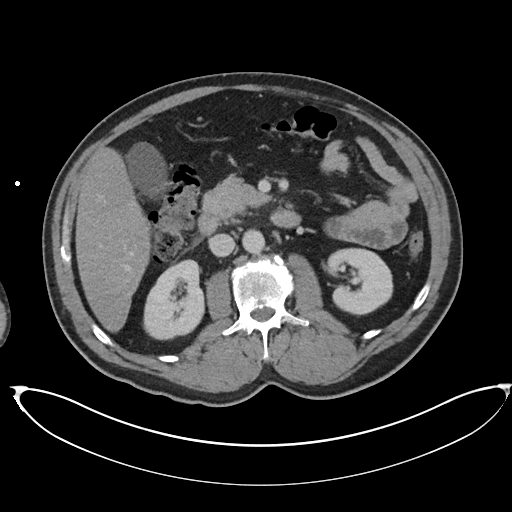
[im 67/104  bone]
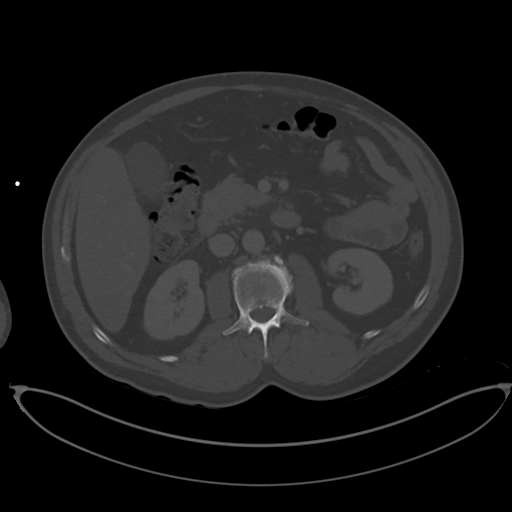
[im 73/104  soft-tissue]
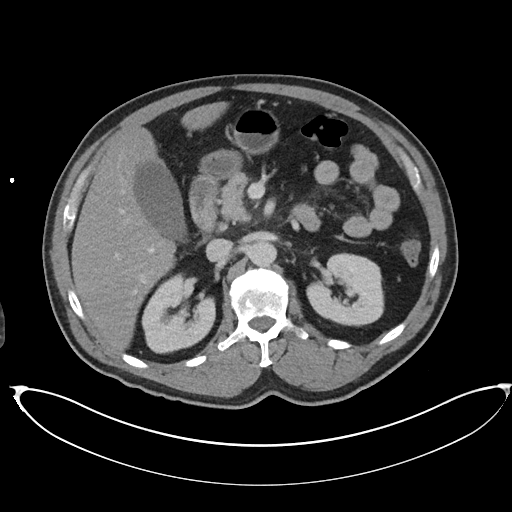
[im 83/104  soft-tissue]
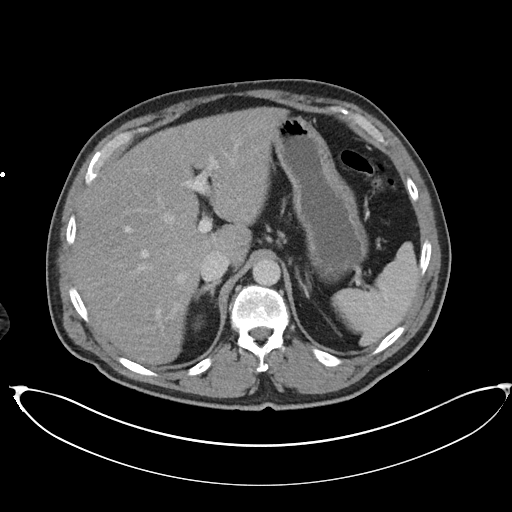
[im 88/104  soft-tissue]
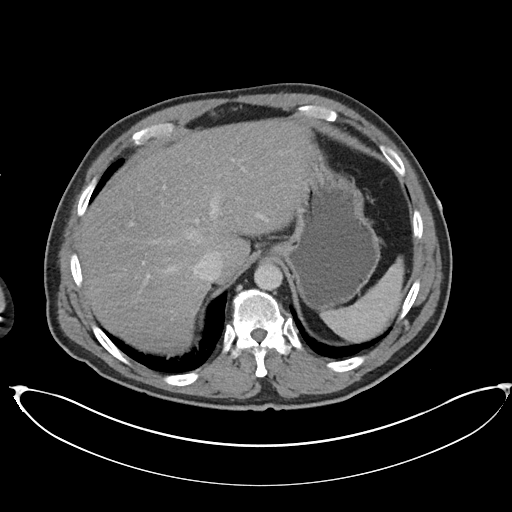
[im 98/104  soft-tissue]
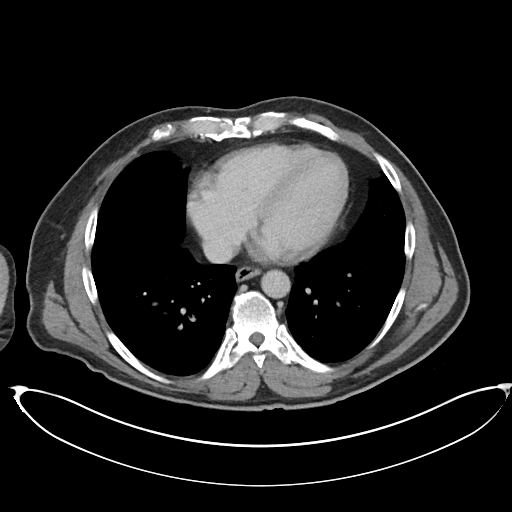

[Series 6: coronal soft tissue · coronal · 0.90mm/px · 3 of 108 slices shown]
[im 36/108  soft-tissue]
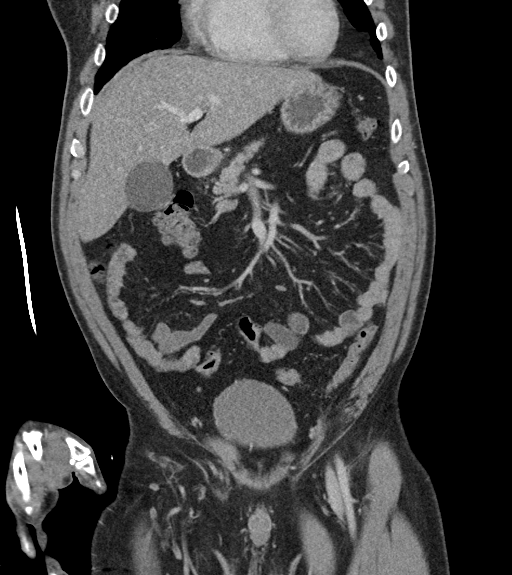
[im 48/108  soft-tissue]
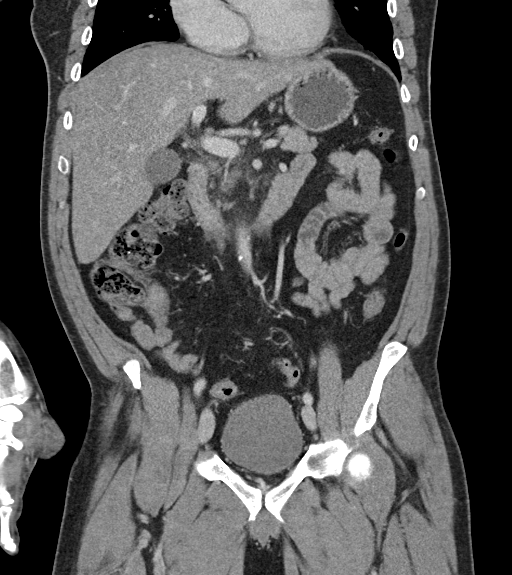
[im 60/108  soft-tissue]
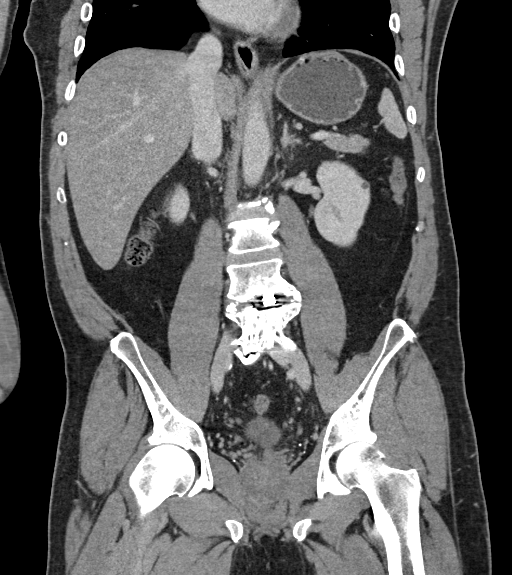

[16 of 46 positions shown; findings below may reference images not displayed]

FINDINGS: Lower chest: No acute abnormality.

Hepatobiliary: Hepatic steatosis. Normal appearance of the
gallbladder.

Pancreas: Normal appearance of the pancreas. Mild peripancreatic fat
stranding and small amount of fluid adjacent to the second portion
of the duodenum.

Spleen: Normal in size without focal abnormality.

Adrenals/Urinary Tract: Adrenal glands are unremarkable. Kidneys are
without renal calculi, focal lesion, or hydronephrosis. Left
superior pole renal scarring. Bladder is unremarkable.

Stomach/Bowel: Stomach is within normal limits. Appendix appears
normal. No evidence of bowel wall thickening, distention, or
inflammatory changes. Scattered colonic diverticulosis. No evidence
of diverticulitis.

Vascular/Lymphatic: No significant vascular findings are present.
Mild upper abdominal retroperitoneal lymphadenopathy.

Reproductive: Prostate is unremarkable.

Other: No abdominal wall hernia or abnormality. No abdominopelvic
ascites.

Musculoskeletal: L4-L5 and L5-S1 anterior fusion. Posterior facet
arthropathy at the same levels. Mild multilevel osteoarthritic
changes of the visualized spine otherwise.
IMPRESSION: Inflammatory fat stranding surrounding the pancreatic head and the
second portion of the duodenum with small amount of free fluid
tracking along the second portion of the duodenum. Findings may
represent acute pancreatitis or duodenitis/duodenal ulcer. Mild
associated upper abdominal retroperitoneal lymphadenopathy.

Hepatic steatosis.

## 2020-04-29 ENCOUNTER — Telehealth: Payer: Self-pay

## 2020-04-29 NOTE — Telephone Encounter (Signed)
NOTES ON FILE FROM [facility] [phone], SENT REFERRAL TO SCHEDULING

## 2020-05-26 ENCOUNTER — Ambulatory Visit: Payer: BLUE CROSS/BLUE SHIELD | Admitting: Cardiology

## 2020-06-16 ENCOUNTER — Ambulatory Visit: Payer: Self-pay | Admitting: Cardiology

## 2023-06-17 ENCOUNTER — Other Ambulatory Visit: Payer: Self-pay | Admitting: Urology

## 2023-06-17 DIAGNOSIS — R972 Elevated prostate specific antigen [PSA]: Secondary | ICD-10-CM

## 2023-07-13 ENCOUNTER — Encounter: Payer: Self-pay | Admitting: Urology

## 2023-07-29 ENCOUNTER — Ambulatory Visit
Admission: RE | Admit: 2023-07-29 | Discharge: 2023-07-29 | Disposition: A | Discharge status: Home / Self Care | Payer: Self-pay | Source: Ambulatory Visit | Attending: Urology | Admitting: Urology

## 2023-07-29 DIAGNOSIS — R972 Elevated prostate specific antigen [PSA]: Secondary | ICD-10-CM

## 2023-07-29 MED ORDER — GADOPICLENOL 0.5 MMOL/ML IV SOLN
9.0000 mL | Freq: Once | INTRAVENOUS | Status: AC | PRN
  Administered 2023-07-29: 9 mL via INTRAVENOUS
